# Patient Record
Sex: Female | Born: 2000 | Race: White | Hispanic: No | Marital: Single | Smoking: Never smoker
Health system: Southern US, Community
[De-identification: ages and names within clinical notes are randomized; demographics above are authoritative.]

## PROBLEM LIST (undated history)

## (undated) DIAGNOSIS — D759 Disease of blood and blood-forming organs, unspecified: Secondary | ICD-10-CM

## (undated) DIAGNOSIS — N920 Excessive and frequent menstruation with regular cycle: Secondary | ICD-10-CM

## (undated) HISTORY — DX: Disease of blood and blood-forming organs, unspecified: D75.9

## (undated) HISTORY — DX: Excessive and frequent menstruation with regular cycle: N92.0

---
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
## 2019-03-05 ENCOUNTER — Other Ambulatory Visit: Payer: Self-pay | Admitting: *Deleted

## 2019-03-05 DIAGNOSIS — Z3041 Encounter for surveillance of contraceptive pills: Secondary | ICD-10-CM

## 2019-03-05 MED ORDER — LEVONORGESTREL-ETHINYL ESTRAD 0.1-20 MG-MCG PO TABS: 1.0000 | ORAL_TABLET | Freq: Every day | ORAL | 6 refills | Status: DC

## 2019-03-27 ENCOUNTER — Other Ambulatory Visit: Payer: Self-pay

## 2019-03-27 ENCOUNTER — Ambulatory Visit (INDEPENDENT_AMBULATORY_CARE_PROVIDER_SITE_OTHER): Payer: BC Managed Care – PPO

## 2019-03-27 DIAGNOSIS — N760 Acute vaginitis: Secondary | ICD-10-CM | POA: Diagnosis not present

## 2019-03-27 DIAGNOSIS — N898 Other specified noninflammatory disorders of vagina: Secondary | ICD-10-CM

## 2019-03-27 DIAGNOSIS — B9689 Other specified bacterial agents as the cause of diseases classified elsewhere: Secondary | ICD-10-CM

## 2019-03-27 NOTE — Progress Notes (Signed)
SUBJECTIVE:  20 y.o. female complains of vaginal discharge for a couple of days.Denies abnormal vaginal bleeding or significant pelvic pain or fever. No UTI symptoms. Denies history of known exposure to STD.  No LMP recorded.  OBJECTIVE:  She appears well, afebrile. Urine dipstick:: not done    ASSESSMENT:  Vaginal Discharge:small amount  Vaginal Odor: not at this time   PLAN:   BVAG, CVAG probe sent to lab. Patient opt out GC/CH. Treatment: To be determined once lab results are received ROV prn if symptoms persist or worsen.

## 2019-03-28 LAB — CERVICOVAGINAL ANCILLARY ONLY
Bacterial Vaginitis (gardnerella): POSITIVE — AB
Candida Glabrata: NEGATIVE
Candida Vaginitis: NEGATIVE
Comment: NEGATIVE
Comment: NEGATIVE
Comment: NEGATIVE

## 2019-03-29 ENCOUNTER — Other Ambulatory Visit: Payer: Self-pay | Admitting: Family Medicine

## 2019-03-29 MED ORDER — METRONIDAZOLE 500 MG PO TABS: 500.0000 mg | ORAL_TABLET | Freq: Two times a day (BID) | ORAL | 0 refills | Status: DC

## 2019-03-29 NOTE — Progress Notes (Signed)
Lab results as BV--rx sent in.

## 2019-10-07 ENCOUNTER — Encounter: Payer: Self-pay | Admitting: Radiology

## 2019-10-16 ENCOUNTER — Other Ambulatory Visit: Payer: Self-pay

## 2019-10-16 ENCOUNTER — Other Ambulatory Visit: Payer: BC Managed Care – PPO

## 2019-10-16 DIAGNOSIS — Z20822 Contact with and (suspected) exposure to covid-19: Secondary | ICD-10-CM

## 2019-10-18 LAB — NOVEL CORONAVIRUS, NAA: SARS-CoV-2, NAA: NOT DETECTED

## 2019-10-18 LAB — SPECIMEN STATUS REPORT

## 2019-10-18 LAB — SARS-COV-2, NAA 2 DAY TAT

## 2022-08-14 ENCOUNTER — Emergency Department (HOSPITAL_COMMUNITY): Payer: Managed Care, Other (non HMO)

## 2022-08-14 ENCOUNTER — Encounter (HOSPITAL_COMMUNITY): Payer: Self-pay

## 2022-08-14 ENCOUNTER — Other Ambulatory Visit: Payer: Self-pay

## 2022-08-14 ENCOUNTER — Emergency Department (HOSPITAL_COMMUNITY)
Admission: EM | Admit: 2022-08-14 | Discharge: 2022-08-15 | Disposition: A | Discharge status: Home / Self Care | Payer: Managed Care, Other (non HMO) | Attending: Emergency Medicine | Admitting: Emergency Medicine

## 2022-08-14 DIAGNOSIS — R102 Pelvic and perineal pain: Secondary | ICD-10-CM | POA: Diagnosis not present

## 2022-08-14 DIAGNOSIS — N939 Abnormal uterine and vaginal bleeding, unspecified: Secondary | ICD-10-CM | POA: Insufficient documentation

## 2022-08-14 LAB — URINALYSIS, ROUTINE W REFLEX MICROSCOPIC
Bacteria, UA: NONE SEEN
Bilirubin Urine: NEGATIVE
Glucose, UA: NEGATIVE mg/dL
Hgb urine dipstick: NEGATIVE
Ketones, ur: NEGATIVE mg/dL
Leukocytes,Ua: NEGATIVE
Nitrite: NEGATIVE
Protein, ur: NEGATIVE mg/dL
Specific Gravity, Urine: 1.001 — ABNORMAL LOW (ref 1.005–1.030)
pH: 6 (ref 5.0–8.0)

## 2022-08-14 LAB — COMPREHENSIVE METABOLIC PANEL
ALT: 14 U/L (ref 0–44)
AST: 17 U/L (ref 15–41)
Albumin: 4.2 g/dL (ref 3.5–5.0)
Alkaline Phosphatase: 103 U/L (ref 38–126)
Anion gap: 10 (ref 5–15)
BUN: 8 mg/dL (ref 6–20)
CO2: 25 mmol/L (ref 22–32)
Calcium: 9.2 mg/dL (ref 8.9–10.3)
Chloride: 102 mmol/L (ref 98–111)
Creatinine, Ser: 0.74 mg/dL (ref 0.44–1.00)
GFR, Estimated: >60 mL/min (ref >60)
Glucose, Bld: 89 mg/dL (ref 70–99)
Potassium: 3.7 mmol/L (ref 3.5–5.1)
Sodium: 137 mmol/L (ref 135–145)
Total Bilirubin: 0.5 mg/dL (ref 0.3–1.2)
Total Protein: 7.3 g/dL (ref 6.5–8.1)

## 2022-08-14 LAB — CBC
HCT: 40.8 % (ref 36.0–46.0)
Hemoglobin: 13.8 g/dL (ref 12.0–15.0)
MCH: 31.9 pg (ref 26.0–34.0)
MCHC: 33.8 g/dL (ref 30.0–36.0)
MCV: 94.4 fL (ref 80.0–100.0)
Platelets: 383 10*3/uL (ref 150–400)
RBC: 4.32 MIL/uL (ref 3.87–5.11)
RDW: 12.2 % (ref 11.5–15.5)
WBC: 9.3 10*3/uL (ref 4.0–10.5)
nRBC: 0 % (ref 0.0–0.2)

## 2022-08-14 LAB — HCG, SERUM, QUALITATIVE: Preg, Serum: NEGATIVE

## 2022-08-14 NOTE — ED Notes (Signed)
Pt gone to ultrasound

## 2022-08-14 NOTE — ED Triage Notes (Signed)
Pt arrived from home via POV. Pt states that she had her period July 1st - 4th. Then last night when the pt wiped after using the restroom had some dark brown blood followed by a clump of what looks like tissue in the picture. Pt is lightly bleeding at present. Pt denies being pregnant but also denies any contraceptive use. Pt states that she is cramping, but it is a different cramp from the normal menstrual cramps currently 5/10 on pain scale.

## 2022-08-14 NOTE — ED Provider Notes (Incomplete)
[place] EMERGENCY DEPARTMENT AT [facility] Provider Note   CSN: [id number] Arrival date & time: 08/14/22  1903     History {Add pertinent medical, surgical, social history, OB history to HPI:1} Chief Complaint  Patient presents with   Vaginal Bleeding    Andrea Todd is a 22 y.o. female.  Patient presents to the emergency department complaining of vaginal bleeding.  Patient states that she had her normal period from July 1 through 4.  She states that last night she noticed dark brown blood followed by a clump of tissue after wiping.  She has been bleeding lightly today.  She states she has gone through 4 tampons throughout the day.  She denies contraceptive use.  Patient also endorses crampy abdominal pain, worse in the left lower pelvic region. No reported shortness of breath, dysuria, vaginal discharge, chest pain, headache, weakness.  Past medical history significant for blood disorder, reportedly similar to von Willebrand's but not von Willebrand's.  Patient unsure of actual disorder.  HPI     Home Medications Prior to Admission medications   Medication Sig Start Date End Date Taking? Authorizing Provider  ferrous fumarate (HEMOCYTE - 106 MG FE) 325 (106 Fe) MG TABS tablet Take by mouth.    [provider]  levonorgestrel-ethinyl estradiol (AVIANE) 0.1-20 MG-MCG tablet Take 1 tablet by mouth daily. 03/05/19   Anyanwu, Jethro Bastos, MD  metroNIDAZOLE (FLAGYL) 500 MG tablet Take 1 tablet (500 mg total) by mouth 2 (two) times daily. 03/29/19   Reva Bores, MD      Allergies    Patient has no known allergies.    Review of Systems   Review of Systems  Physical Exam Updated Vital Signs BP 128/83 (BP Location: Right Arm)   Pulse 66   Temp 98.4 F (36.9 C) (Oral)   Resp 18   LMP 07/25/2022 (Exact Date)   SpO2 100%  Physical Exam Vitals and nursing note reviewed.  Constitutional:      General: She is not in acute distress.    Appearance: She is  well-developed.  HENT:     Head: Normocephalic and atraumatic.  Eyes:     Conjunctiva/sclera: Conjunctivae normal.  Cardiovascular:     Rate and Rhythm: Normal rate and regular rhythm.     Heart sounds: No murmur heard. Pulmonary:     Effort: Pulmonary effort is normal. No respiratory distress.     Breath sounds: Normal breath sounds.  Abdominal:     Palpations: Abdomen is soft.     Tenderness: There is abdominal tenderness (Mild suprapubic tenderness).  Musculoskeletal:        General: No swelling.     Cervical back: Neck supple.  Skin:    General: Skin is warm and dry.     Capillary Refill: Capillary refill takes less than 2 seconds.  Neurological:     Mental Status: She is alert.  Psychiatric:        Mood and Affect: Mood normal.     ED Results / Procedures / Treatments   Labs (all labs ordered are listed, but only abnormal results are displayed) Labs Reviewed  URINALYSIS, ROUTINE W REFLEX MICROSCOPIC - Abnormal; Notable for the following components:      Result Value   Color, Urine COLORLESS (*)    Specific Gravity, Urine 1.001 (*)    All other components within normal limits  HCG, SERUM, QUALITATIVE  CBC  COMPREHENSIVE METABOLIC PANEL    EKG None  Radiology No  results found.  Procedures .1-3 Lead EKG Interpretation  Performed by: Darrick Grinder, PA-C Authorized by: Darrick Grinder, PA-C     Interpretation: normal     ECG rate:  66   ECG rate assessment: normal     Rhythm: sinus rhythm     Ectopy: none     Conduction: normal     {Document cardiac monitor, telemetry assessment procedure when appropriate:1}  Medications Ordered in ED Medications - No data to display  ED Course/ Medical Decision Making/ A&P   {   Click here for ABCD2, HEART and other calculatorsREFRESH Note before signing :1}                          Medical Decision Making Amount and/or Complexity of Data Reviewed Labs: ordered. Radiology: ordered.   This patient  presents to the ED for concern of vaginal bleeding, this involves an extensive number of treatment options, and is a complaint that carries with it a high risk of complications and morbidity.  The differential diagnosis includes abnormal uterine bleeding, ectopic pregnancy, failed pregnancy, others   Co morbidities that complicate the patient evaluation  Von Willebrand's-like disease   Additional history obtained:  Additional history obtained from visitor at bedside External records from outside source obtained and reviewed including primary care note showing history of heavy menstrual bleeding, previously evaluated by hematology with labs showing a low von Willebrand's factor but not diagnostic.  Patient was taking oral contraceptives   Lab Tests:  I Ordered, and personally interpreted labs.  The pertinent results include: Negative pregnancy test, unremarkable CBC, CMP, UA   Imaging Studies ordered:  I ordered imaging studies including pelvic ultrasound I independently visualized and interpreted imaging which showed *** I agree with the radiologist interpretation   Cardiac Monitoring: / EKG:  The patient was maintained on a cardiac monitor.  I personally viewed and interpreted the cardiac monitored which showed an underlying rhythm of: Sinus rhythm   Consultations Obtained:  I requested consultation with the ***,  and discussed lab and imaging findings as well as pertinent plan - they recommend: ***   Problem List / ED Course / Critical interventions / Medication management  *** I ordered medication including ***  for ***  Reevaluation of the patient after these medicines showed that the patient {resolved/improved/worsened:23923::"improved"} I have reviewed the patients home medicines and have made adjustments as needed   Social Determinants of Health:  ***   Test / Admission - Considered:  ***   {Document critical care time when appropriate:1} {Document review  of labs and clinical decision tools ie heart score, Chads2Vasc2 etc:1}  {Document your independent review of radiology images, and any outside records:1} {Document your discussion with family members, caretakers, and with consultants:1} {Document social determinants of health affecting pt's care:1} {Document your decision making why or why not admission, treatments were needed:1} Final Clinical Impression(s) / ED Diagnoses Final diagnoses:  Pelvic pain    Rx / DC Orders ED Discharge Orders     None

## 2022-08-15 NOTE — Discharge Instructions (Signed)
You were evaluated tonight for vaginal bleeding.  Your ultrasound was reassuring with no acute findings.  Your lab work shows no signs of anemia at this time.  If you develop shortness of breath, dizziness, or other life-threatening symptoms please return to the emergency department.  Otherwise please schedule a follow-up appointment with OB/GYN for further evaluation of your abnormal uterine bleeding.

## 2022-11-06 NOTE — Progress Notes (Unsigned)
   New Patient Office Visit   Subjective   Patient ID: Andrea Todd, female    DOB: [birth year]  Age: 22 y.o. MRN: [id number]  CC: No chief complaint on file.   HPI Andrea Todd 22 year old female, presents to establish care. She  has a past medical history of Blood disorder.  HPI    Outpatient Encounter Medications as of 11/07/2022  Medication Sig   ferrous fumarate (HEMOCYTE - 106 MG FE) 325 (106 Fe) MG TABS tablet Take by mouth.   levonorgestrel-ethinyl estradiol (AVIANE) 0.1-20 MG-MCG tablet Take 1 tablet by mouth daily.   metroNIDAZOLE (FLAGYL) 500 MG tablet Take 1 tablet (500 mg total) by mouth 2 (two) times daily.   No facility-administered encounter medications on file as of 11/07/2022.    No past surgical history on file.  ROS    Objective    There were no vitals taken for this visit.  Physical Exam    Assessment & Plan:  There are no diagnoses linked to this encounter.  No follow-ups on file.   Cruzita Lederer Newman Nip, FNP

## 2022-11-06 NOTE — Patient Instructions (Signed)

## 2022-11-07 ENCOUNTER — Ambulatory Visit (INDEPENDENT_AMBULATORY_CARE_PROVIDER_SITE_OTHER): Payer: Managed Care, Other (non HMO) | Admitting: Family Medicine

## 2022-11-07 ENCOUNTER — Encounter: Payer: Self-pay | Admitting: Family Medicine

## 2022-11-07 VITALS — BP 119/80 | HR 98 | Ht 65.0 in | Wt 182.0 lb

## 2022-11-07 DIAGNOSIS — D509 Iron deficiency anemia, unspecified: Secondary | ICD-10-CM | POA: Diagnosis not present

## 2022-11-07 DIAGNOSIS — E559 Vitamin D deficiency, unspecified: Secondary | ICD-10-CM | POA: Diagnosis not present

## 2022-11-07 DIAGNOSIS — R7301 Impaired fasting glucose: Secondary | ICD-10-CM

## 2022-11-07 DIAGNOSIS — Z114 Encounter for screening for human immunodeficiency virus [HIV]: Secondary | ICD-10-CM

## 2022-11-07 DIAGNOSIS — F320 Major depressive disorder, single episode, mild: Secondary | ICD-10-CM

## 2022-11-07 DIAGNOSIS — N946 Dysmenorrhea, unspecified: Secondary | ICD-10-CM | POA: Insufficient documentation

## 2022-11-07 DIAGNOSIS — E038 Other specified hypothyroidism: Secondary | ICD-10-CM

## 2022-11-07 DIAGNOSIS — Z1159 Encounter for screening for other viral diseases: Secondary | ICD-10-CM

## 2022-11-07 DIAGNOSIS — M5441 Lumbago with sciatica, right side: Secondary | ICD-10-CM

## 2022-11-07 DIAGNOSIS — Z136 Encounter for screening for cardiovascular disorders: Secondary | ICD-10-CM

## 2022-11-07 DIAGNOSIS — G8929 Other chronic pain: Secondary | ICD-10-CM

## 2022-11-07 DIAGNOSIS — M545 Low back pain, unspecified: Secondary | ICD-10-CM | POA: Insufficient documentation

## 2022-11-07 MED ORDER — MELOXICAM 7.5 MG PO TABS: 7.5000 mg | ORAL_TABLET | Freq: Every day | ORAL | 2 refills | Status: DC

## 2022-11-07 NOTE — Assessment & Plan Note (Signed)
Trial on Mobic 7.5 mg PRN We discussed the desired effects and potential side effects of the prescribed medication for back pain. Additionally, we reviewed non-pharmacological interventions, including the importance of rest, avoiding twisting, improper bending, and straining the lower back. I demonstrated proper body mechanics to prevent further injury and advised alternating between ice and heat therapy for relief. Stretching exercises for both the back and legs were recommended to improve flexibility and support recovery. The patient was advised to follow up if symptoms worsen or persist. The patient expressed understanding of the treatment plan, and all questions were thoroughly addressed.

## 2022-11-07 NOTE — Assessment & Plan Note (Signed)
Flowsheet Row Office Visit from 11/07/2022 in [facility]  PHQ-9 Total Score 11      Patient interested in starting therapy no medication intervention.  We discussed several non-pharmacological approaches to managing depression, including:  Establishing a consistent daily routine: This helps create structure and stability. Practicing mindfulness and relaxation techniques: Incorporating meditation, deep breathing exercises, or yoga to manage stress and improve emotional well-being. Engaging in regular physical activity: Aim for at least 30 minutes of exercise most days to boost mood and energy levels. Spending time outdoors: Exposure to natural light and fresh air can improve mental health. Building a support network: Encouraging social connections with friends, family, or support groups to reduce feelings of isolation. Prioritizing a balanced diet: Eating nutrient-rich foods while avoiding excessive amounts of processed foods, sugar, and unhealthy fats.   Patient verbally consented to [facility] services about presenting concerns and psychiatric consultation as appropriate. The services will be billed as appropriate for the patient.

## 2022-11-14 ENCOUNTER — Other Ambulatory Visit: Payer: Self-pay

## 2022-11-14 ENCOUNTER — Telehealth: Payer: Self-pay | Admitting: Family Medicine

## 2022-11-14 ENCOUNTER — Encounter (HOSPITAL_COMMUNITY): Payer: Self-pay | Admitting: *Deleted

## 2022-11-14 ENCOUNTER — Emergency Department (HOSPITAL_COMMUNITY)
Admission: EM | Admit: 2022-11-14 | Discharge: 2022-11-14 | Discharge status: Against Medical Advice | Payer: Managed Care, Other (non HMO) | Attending: Emergency Medicine | Admitting: Emergency Medicine

## 2022-11-14 DIAGNOSIS — Z5329 Procedure and treatment not carried out because of patient's decision for other reasons: Secondary | ICD-10-CM | POA: Diagnosis not present

## 2022-11-14 DIAGNOSIS — Z5321 Procedure and treatment not carried out due to patient leaving prior to being seen by health care provider: Secondary | ICD-10-CM

## 2022-11-14 DIAGNOSIS — M549 Dorsalgia, unspecified: Secondary | ICD-10-CM | POA: Insufficient documentation

## 2022-11-14 LAB — POC URINE PREG, ED: Preg Test, Ur: NEGATIVE

## 2022-11-14 NOTE — Telephone Encounter (Signed)
Patient calling says her back is still in extreme pain- went to ED this morning and said they tried to do a urine sample but took way too long and her urine got cold. Patient is wanting to speak with someone regarding recommendations. Please advise Thank you

## 2022-11-14 NOTE — ED Triage Notes (Signed)
Pt states she woke up this am with pain to bilateral ribs and lower back; pt states it hurts worse with deep breathing

## 2022-11-14 NOTE — Telephone Encounter (Signed)
Pt called back LVM in regard to previous tele message. Wants a call back

## 2022-11-14 NOTE — ED Provider Notes (Signed)
[place] EMERGENCY DEPARTMENT AT [facility] Provider Note   CSN: [id number] Arrival date & time: 11/14/22  4401     History Chief Complaint  Patient presents with   Back Pain    Andrea Todd is a 22 y.o. female with h/o chronic back pain presents the emerged from today for evaluation of back pain.  Patient reports that she woke up in pain this morning.  Pain is worse with movement.  She reports that whenever she takes a deep breath she feels that the muscles catch in her back.  She denies any shortness of breath or any chest pain.  Denies any recent cough or cold symptoms or any fevers.  Denies any falls or any traumas.  Unsure about the nursing note says abdominal pain is patient is not complaining about any abdominal pain or any nausea or vomiting.  She reports that she recently celebrated her birthday and was partying a lot this weekend and was dancing was more active that she thinks may be causing her pain.  She denies any recent surgeries any long car rides.  She is currently not taking any OCPs.  She denies any chest pain, vomiting, Donnell pain, diarrhea, constipation, hematochezia, melena, dysuria, hematuria, vaginal bleeding or vaginal discharge, or any shortness of breath.  Her primary care provider prescribed her meloxicam however she has not taken it other than today.   Back Pain Associated symptoms: no chest pain, no dysuria, no fever, no numbness and no weakness        Home Medications Prior to Admission medications   Medication Sig Start Date End Date Taking? Authorizing Provider  ferrous fumarate (HEMOCYTE - 106 MG FE) 325 (106 Fe) MG TABS tablet Take by mouth.    [provider]  fluticasone (FLONASE) 50 MCG/ACT nasal spray Place into the nose. 02/02/22 02/02/23  [provider]  levonorgestrel-ethinyl estradiol (AVIANE) 0.1-20 MG-MCG tablet Take 1 tablet by mouth daily. Patient not taking: Reported on 11/07/2022 03/05/19   Tereso Newcomer, MD  meloxicam (MOBIC) 7.5 MG tablet Take 1 tablet (7.5 mg total) by mouth daily. 11/07/22   Del Nigel Berthold, FNP  metroNIDAZOLE (FLAGYL) 500 MG tablet Take 1 tablet (500 mg total) by mouth 2 (two) times daily. Patient not taking: Reported on 11/07/2022 03/29/19   Reva Bores, MD      Allergies    Patient has no known allergies.    Review of Systems   Review of Systems  Constitutional:  Negative for chills and fever.  Respiratory:  Negative for shortness of breath.   Cardiovascular:  Negative for chest pain.  Gastrointestinal:  Negative for abdominal distention, constipation, diarrhea, nausea and vomiting.       Denies any fecal incontinence  Genitourinary:  Negative for dysuria and hematuria.       Denies any urinary incontinence or urinary retention.  Musculoskeletal:  Positive for back pain.       Denies any saddle anesthesia  Neurological:  Negative for weakness and numbness.    Physical Exam Updated Vital Signs BP (!) 134/99 (BP Location: Right Arm)   Pulse 71   Temp (!) 97.5 F (36.4 C) (Oral)   Ht 5\' 5"  (1.651 m)   Wt 82.6 kg   LMP 11/06/2022   SpO2 97%   BMI 30.28 kg/m  Physical Exam Vitals and nursing note reviewed.  Constitutional:      General: She is not in acute distress.    Appearance: She  is not ill-appearing or toxic-appearing.  Eyes:     General: No scleral icterus. Cardiovascular:     Rate and Rhythm: Normal rate.  Pulmonary:     Effort: Pulmonary effort is normal. No respiratory distress.     Breath sounds: Normal breath sounds.  Abdominal:     Palpations: Abdomen is soft.     Tenderness: There is no abdominal tenderness. There is no guarding or rebound.  Musculoskeletal:     Cervical back: Normal range of motion. No rigidity.       Back:     Comments: Tenderness palpation upon the marked areas.  No midline tenderness palpation.  No overlying skin changes noted.  No overlying erythema, warmth, induration, fluctuance, or rash noted.   Strength is equal and symmetric in patient's bilateral upper and lower extremities.  Sensation reportedly intact.  She is amatory with steady gait.  Skin:    General: Skin is warm and dry.  Neurological:     General: No focal deficit present.     Mental Status: She is alert. Mental status is at baseline.     Gait: Gait normal.     ED Results / Procedures / Treatments   Labs (all labs ordered are listed, but only abnormal results are displayed) Labs Reviewed  POC URINE PREG, ED    EKG None  Radiology No results found.  Procedures Procedures   Medications Ordered in ED Medications - No data to display  ED Course/ Medical Decision Making/ A&P   Medical Decision Making  22 y.o. female presents to the ER for evaluation of back pain. Differential diagnosis includes but is not limited to Fracture (acute/chronic), muscle strain, cauda equina, spinal stenosis, DDD, ligamentous injury, disk herniation, metastatic cancer, vertebral osteomyelitis, kidney stone, pyelonephritis, AAA, pancreatitis, bowel obstruction, meningitis. Vital signs Mildly elevated BP at 134/99, temp 97.11F. RR was not documented, however was around 16. She was not tachypenic and was speaking in full sentences. Physical exam as noted above.   The pain is reproducible upon palpation and upon movement.  I do not see any overlying skin changes.  I think this is likely musculoskeletal.  She denies any trauma to the area.  She declines x-rays.  I discussed with her that I will need a urine sample before giving medications  I independently reviewed and interpreted the patient's labs. Urinalysis still needs to be collected.  Overall, I do not think this is any meningitis.  She does not have any recent cough or cold symptoms.  She has no nuchal rigidity.  She is ambulatory with steady gait and is moving all extremities and equal strength.  She denies any IV drug use ever, I have lower excision for any vertebral osteomyelitis  or any epidural abscess.  This is likely musculoskeletal pain as the pain is mainly paraspinal in comparison to midline.  Unfortunately, was alerted that patient eloped from the ER.   Portions of this report may have been transcribed using voice recognition software. Every effort was made to ensure accuracy; however, inadvertent computerized transcription errors may be present.   Final Clinical Impression(s) / ED Diagnoses Final diagnoses:  Eloped from emergency department    Rx / DC Orders ED Discharge Orders     None         Achille Rich, PA-C 11/14/22 1019    Pricilla Loveless, MD 11/17/22 0710

## 2022-11-20 NOTE — Patient Instructions (Signed)

## 2022-11-20 NOTE — Progress Notes (Unsigned)
   Established Patient Office Visit   Subjective  Patient ID: Andrea Todd, female    DOB: 09-26-2000  Age: 22 y.o. MRN: [id number]  No chief complaint on file.   She  has a past medical history of Blood disorder and Heavy menstrual bleeding.  HPI  Patient presents to the clinic for    ROS    Objective:     LMP 11/06/2022  {Vitals History (Optional):23777}  Physical Exam   No results found for any visits on 11/21/22.  The ASCVD Risk score (Arnett DK, et al., 2019) failed to calculate for the following reasons:   The 2019 ASCVD risk score is only valid for ages 64 to 87    Assessment & Plan:  There are no diagnoses linked to this encounter.  No follow-ups on file.   Cruzita Lederer Newman Nip, FNP

## 2022-11-21 ENCOUNTER — Encounter: Payer: Self-pay | Admitting: Family Medicine

## 2022-11-21 ENCOUNTER — Ambulatory Visit (INDEPENDENT_AMBULATORY_CARE_PROVIDER_SITE_OTHER): Payer: Managed Care, Other (non HMO) | Admitting: Family Medicine

## 2022-11-21 VITALS — BP 116/76 | HR 111 | Ht 65.0 in | Wt 186.1 lb

## 2022-11-21 DIAGNOSIS — M545 Low back pain, unspecified: Secondary | ICD-10-CM

## 2022-11-21 NOTE — Assessment & Plan Note (Signed)
Patient reports that her back pain has resolved and is well-controlled with Flexeril 10 mg, which she takes nightly. However, if she misses a dose, the back pain returns Referral placed for physical therapy  Referral placed to Orthopedics for further evaluation

## 2022-11-29 LAB — LIPID PANEL
Chol/HDL Ratio: 2.6 {ratio} (ref 0.0–4.4)
Cholesterol, Total: 147 mg/dL (ref 100–199)
HDL: 56 mg/dL (ref >39)
LDL Chol Calc (NIH): 75 mg/dL (ref 0–99)
Triglycerides: 87 mg/dL (ref 0–149)
VLDL Cholesterol Cal: 16 mg/dL (ref 5–40)

## 2022-11-29 LAB — CMP14+EGFR
ALT: 15 [IU]/L (ref 0–32)
AST: 17 [IU]/L (ref 0–40)
Albumin: 4.4 g/dL (ref 4.0–5.0)
Alkaline Phosphatase: 104 [IU]/L (ref 44–121)
BUN/Creatinine Ratio: 20 (ref 9–23)
BUN: 15 mg/dL (ref 6–20)
Bilirubin Total: 0.4 mg/dL (ref 0.0–1.2)
CO2: 19 mmol/L — ABNORMAL LOW (ref 20–29)
Calcium: 9.4 mg/dL (ref 8.7–10.2)
Chloride: 101 mmol/L (ref 96–106)
Creatinine, Ser: 0.76 mg/dL (ref 0.57–1.00)
Globulin, Total: 2.5 g/dL (ref 1.5–4.5)
Glucose: 108 mg/dL — ABNORMAL HIGH (ref 70–99)
Potassium: 4.4 mmol/L (ref 3.5–5.2)
Sodium: 137 mmol/L (ref 134–144)
Total Protein: 6.9 g/dL (ref 6.0–8.5)
eGFR: 113 mL/min/{1.73_m2} (ref >59)

## 2022-11-29 LAB — CBC WITH DIFFERENTIAL/PLATELET
Basophils Absolute: 0.1 10*3/uL (ref 0.0–0.2)
Basos: 1 %
EOS (ABSOLUTE): 0.3 10*3/uL (ref 0.0–0.4)
Eos: 3 %
Hematocrit: 44 % (ref 34.0–46.6)
Hemoglobin: 14.3 g/dL (ref 11.1–15.9)
Immature Grans (Abs): 0 10*3/uL (ref 0.0–0.1)
Immature Granulocytes: 0 %
Lymphocytes Absolute: 3.4 10*3/uL — ABNORMAL HIGH (ref 0.7–3.1)
Lymphs: 31 %
MCH: 31.2 pg (ref 26.6–33.0)
MCHC: 32.5 g/dL (ref 31.5–35.7)
MCV: 96 fL (ref 79–97)
Monocytes Absolute: 0.9 10*3/uL (ref 0.1–0.9)
Monocytes: 9 %
Neutrophils Absolute: 6.1 10*3/uL (ref 1.4–7.0)
Neutrophils: 56 %
Platelets: 488 10*3/uL — ABNORMAL HIGH (ref 150–450)
RBC: 4.59 x10E6/uL (ref 3.77–5.28)
RDW: 12 % (ref 11.7–15.4)
WBC: 10.8 10*3/uL (ref 3.4–10.8)

## 2022-11-29 LAB — IRON,TIBC AND FERRITIN PANEL
Ferritin: 74 ng/mL (ref 15–150)
Iron Saturation: 31 % (ref 15–55)
Iron: 87 ug/dL (ref 27–159)
Total Iron Binding Capacity: 283 ug/dL (ref 250–450)
UIBC: 196 ug/dL (ref 131–425)

## 2022-11-29 LAB — HIV ANTIBODY (ROUTINE TESTING W REFLEX): HIV Screen 4th Generation wRfx: NONREACTIVE

## 2022-11-29 LAB — MICROALBUMIN / CREATININE URINE RATIO
Creatinine, Urine: 196.7 mg/dL
Microalb/Creat Ratio: 6 mg/g{creat} (ref 0–29)
Microalbumin, Urine: 11 ug/mL

## 2022-11-29 LAB — ANTINUCLEAR ANTIBODIES, IFA: ANA Titer 1: NEGATIVE

## 2022-11-29 LAB — HEMOGLOBIN A1C
Est. average glucose Bld gHb Est-mCnc: 103 mg/dL
Hgb A1c MFr Bld: 5.2 % (ref 4.8–5.6)

## 2022-11-29 LAB — TSH+FREE T4
Free T4: 1.5 ng/dL (ref 0.82–1.77)
TSH: 1.83 u[IU]/mL (ref 0.450–4.500)

## 2022-11-29 LAB — HEPATITIS C ANTIBODY: Hep C Virus Ab: NONREACTIVE

## 2022-11-29 LAB — VITAMIN B12: Vitamin B-12: 186 pg/mL — ABNORMAL LOW (ref 232–1245)

## 2022-11-29 LAB — VITAMIN D 25 HYDROXY (VIT D DEFICIENCY, FRACTURES): Vit D, 25-Hydroxy: 22.8 ng/mL — ABNORMAL LOW (ref 30.0–100.0)

## 2022-12-08 ENCOUNTER — Telehealth: Payer: Self-pay | Admitting: Orthopedic Surgery

## 2022-12-08 NOTE — Telephone Encounter (Signed)
Patient called back to return Hopes call and she states she will call back and R/S after she has her PT completed

## 2022-12-09 ENCOUNTER — Ambulatory Visit: Payer: Managed Care, Other (non HMO) | Admitting: Orthopedic Surgery

## 2023-01-01 NOTE — Patient Instructions (Signed)

## 2023-01-01 NOTE — Progress Notes (Unsigned)
   Established Patient Office Visit   Subjective  Patient ID: Andrea Todd, female    DOB: 11-28-2000  Age: 22 y.o. MRN: [id number]  No chief complaint on file.   She  has a past medical history of Blood disorder and Heavy menstrual bleeding.  HPI  ROS    Objective:     There were no vitals taken for this visit. {Vitals History (Optional):23777}  Physical Exam   No results found for any visits on 01/02/23.  The ASCVD Risk score (Arnett DK, et al., 2019) failed to calculate for the following reasons:   The 2019 ASCVD risk score is only valid for ages 86 to 47    Assessment & Plan:  There are no diagnoses linked to this encounter.  No follow-ups on file.   Cruzita Lederer Newman Nip, FNP

## 2023-01-02 ENCOUNTER — Encounter: Payer: Self-pay | Admitting: Family Medicine

## 2023-01-02 ENCOUNTER — Ambulatory Visit (INDEPENDENT_AMBULATORY_CARE_PROVIDER_SITE_OTHER): Payer: Managed Care, Other (non HMO) | Admitting: Family Medicine

## 2023-01-02 VITALS — BP 121/81 | HR 86 | Ht 65.0 in | Wt 190.0 lb

## 2023-01-02 DIAGNOSIS — M5441 Lumbago with sciatica, right side: Secondary | ICD-10-CM | POA: Diagnosis not present

## 2023-01-02 DIAGNOSIS — G8929 Other chronic pain: Secondary | ICD-10-CM | POA: Diagnosis not present

## 2023-01-02 DIAGNOSIS — M545 Low back pain, unspecified: Secondary | ICD-10-CM

## 2023-01-02 MED ORDER — CYCLOBENZAPRINE HCL 10 MG PO TABS
10.0000 mg | ORAL_TABLET | Freq: Every evening | ORAL | 2 refills | Status: DC | PRN
Start: 2023-01-02 — End: 2023-02-27

## 2023-01-02 MED ORDER — MELOXICAM 7.5 MG PO TABS
7.5000 mg | ORAL_TABLET | Freq: Every day | ORAL | 1 refills | Status: DC | PRN
Start: 2023-01-02 — End: 2023-02-27

## 2023-01-02 NOTE — Assessment & Plan Note (Addendum)
Controlled on Flexeril PRN and Mobic PRN Patient would like referral to physical therapy placed again We discussed the desired effects and potential side effects of the prescribed medication for back pain. Additionally, we reviewed non-pharmacological interventions, including the importance of rest, avoiding twisting, improper bending, and straining the lower back. I demonstrated proper body mechanics to prevent further injury and advised alternating between ice and heat therapy for relief. Stretching exercises for both the back and legs were recommended to improve flexibility and support recovery. The patient was advised to follow up if symptoms worsen or persist. The patient expressed understanding of the treatment plan, and all questions were thoroughly addressed.

## 2023-01-09 ENCOUNTER — Ambulatory Visit (HOSPITAL_COMMUNITY): Payer: Managed Care, Other (non HMO)

## 2023-01-17 ENCOUNTER — Ambulatory Visit: Payer: Self-pay | Admitting: Family Medicine

## 2023-01-17 ENCOUNTER — Telehealth: Payer: Self-pay | Admitting: Family Medicine

## 2023-01-17 ENCOUNTER — Telehealth: Payer: Managed Care, Other (non HMO) | Admitting: Family Medicine

## 2023-01-17 VITALS — Temp 98.5°F | Ht 65.0 in | Wt 190.0 lb

## 2023-01-17 DIAGNOSIS — J069 Acute upper respiratory infection, unspecified: Secondary | ICD-10-CM | POA: Diagnosis not present

## 2023-01-17 NOTE — Progress Notes (Signed)
[facility]ARE [facility] [address] [place] [address] Dept: [phone] Dept Fax: [phone]  Virtual Video Visit  I connected with Andrea Todd on 01/17/23 at 12:20 PM EST by a video enabled telemedicine application and verified that I am speaking with the correct person using two identifiers.  Location patient: Home Location provider: Clinic Persons participating in the virtual visit: Patient, Provider  I discussed the limitations of evaluation and management by telemedicine and the availability of in person appointments. The patient expressed understanding and agreed to proceed.  Chief Complaint  Patient presents with   Cough    C/o having a cough, head/chest congestion, ears stopped up, and HA x 1 week.  Has taken Ibuprofen and drinking honey.    SUBJECTIVE:  HPI: Andrea Todd is a 22 y.o. female who presents with a 1-week history of nasal congestion and cough. 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 She was sent home and recommended to see a doctor to evaluate if she could have bronchitis. She denies any fever, Her ears have been plugged up. She is having no N/V/D. She has been taking ibuprofen and using honey.  Patient Active Problem List   Diagnosis Date Noted   Dysmenorrhea 11/07/2022   Lumbar pain 11/07/2022   Depression, major, single episode, mild (HCC) 11/07/2022   Oral contraceptive pill surveillance 12/19/2018   Distal radius fracture 11/18/2010   History reviewed. No pertinent surgical history. Family History  Problem Relation Age of Onset   Hypertension Mother    Hypertension Maternal Grandmother    Diabetes Maternal Grandmother    Hypertension Maternal Grandfather    Hypertension Paternal Grandmother    Diabetes Paternal Grandmother    Hypertension Paternal Grandfather    Social History   Tobacco Use   Smoking status: Every Day    Current  packs/day: 0.50    Types: Cigarettes   Smokeless tobacco: Never  Vaping Use   Vaping status: Every Day   Substances: Flavoring  Substance Use Topics   Alcohol use: No    Comment: occasional   Drug use: Yes    Types: Marijuana    Current Outpatient Medications:    cholecalciferol (VITAMIN D3) 25 MCG (1000 UNIT) tablet, Take 1,000 Units by mouth daily., Disp: , Rfl:    cyclobenzaprine (FLEXERIL) 10 MG tablet, Take 1 tablet (10 mg total) by mouth at bedtime as needed., Disp: 30 tablet, Rfl: 2   ferrous sulfate 325 (65 FE) MG EC tablet, Take 325 mg by mouth daily with breakfast., Disp: , Rfl:    meloxicam (MOBIC) 7.5 MG tablet, Take 1 tablet (7.5 mg total) by mouth daily as needed for pain., Disp: 30 tablet, Rfl: 1   Multiple Vitamin (MULTIVITAMIN WITH MINERALS) TABS tablet, Take 1 tablet by mouth daily., Disp: , Rfl:    vitamin B-12 (CYANOCOBALAMIN) 100 MCG tablet, Take 100 mcg by mouth daily., Disp: , Rfl:  No Known Allergies ROS: See pertinent positives and negatives per HPI.  OBSERVATIONS/OBJECTIVE:  VITALS per patient if applicable: Today's Vitals   01/17/23 1205  Temp: 98.5 F (36.9 C)  TempSrc: Temporal  Weight: 190 lb (86.2 kg)  Height: 5\' 5"  (1.651 m)   Body mass index is 31.62 kg/m.   GENERAL: Alert and oriented. Appears well and in no acute distress. HEENT: Atraumatic. Eyes clear. No obvious abnormalities on inspection of external nose and ears. NECK: Normal movements of the  head and neck. LUNGS: On inspection, no signs of respiratory distress. Breathing rate appears normal. No obvious gross SOB, gasping or wheezing, and no conversational dyspnea. Mild cough present. CV: No obvious cyanosis. MS: Moves all visible extremities without noticeable abnormality. PSYCH/NEURO: Pleasant and cooperative. No obvious depression or anxiety. Speech and thought processing grossly intact.  ASSESSMENT AND PLAN:  Problem List Items Addressed This Visit       Respiratory    Viral URI with cough - Primary   Discussed home care for viral illness, including rest, pushing fluids, and OTC medications as needed for symptom relief. Recommend hot tea with honey for sore throat symptoms. Benadryl as needed for rhinorrhea. Follow-up if needed for worsening or persistent symptoms.         I discussed the assessment and treatment plan with the patient. The patient was provided an opportunity to ask questions and all were answered. The patient agreed with the plan and demonstrated an understanding of the instructions.   The patient was advised to call back or seek an in-person evaluation if the symptoms worsen or if the condition fails to improve as anticipated.  Return if symptoms worsen or fail to improve.   Loyola Mast, MD

## 2023-01-17 NOTE — Telephone Encounter (Signed)
Reason for Disposition . [1] Continuous (nonstop) coughing interferes with work or school AND [2] no improvement using cough treatment per Care Advice  Answer Assessment - Initial Assessment Questions 1. ONSET: "When did the cough begin?"      X 1 week 2. SEVERITY: "How bad is the cough today?"      Worse at night; pt states it is currently fine but it is intermittent. Lasts a few minutes and comes and goes.  3. SPUTUM: "Describe the color of your sputum" (none, dry cough; clear, white, yellow, green)     Denies sputum.  4. HEMOPTYSIS: "Are you coughing up any blood?" If so ask: "How much?" (flecks, streaks, tablespoons, etc.)     Denies blood.  5. DIFFICULTY BREATHING: "Are you having difficulty breathing?" If Yes, ask: "How bad is it?" (e.g., mild, moderate, severe)    - MILD: No SOB at rest, mild SOB with walking, speaks normally in sentences, can lie down, no retractions, pulse < 100.    - MODERATE: SOB at rest, SOB with minimal exertion and prefers to sit, cannot lie down flat, speaks in phrases, mild retractions, audible wheezing, pulse 100-120.    - SEVERE: Very SOB at rest, speaks in single words, struggling to breathe, sitting hunched forward, retractions, pulse > 120      Denies SOB.  6. FEVER: "Do you have a fever?" If Yes, ask: "What is your temperature, how was it measured, and when did it start?"     Denies fever.  7. CARDIAC HISTORY: "Do you have any history of heart disease?" (e.g., heart attack, congestive heart failure)      Denies cardiac history.  8. LUNG HISTORY: "Do you have any history of lung disease?"  (e.g., pulmonary embolus, asthma, emphysema)     Denies lung history.  9. PE RISK FACTORS: "Do you have a history of blood clots?" (or: recent major surgery, recent prolonged travel, bedridden)     Pt states she has a known blood disorder that causes thicker blood and clotting.  10. OTHER SYMPTOMS: "Do you have any other symptoms?" (e.g., runny nose, wheezing,  chest pain)       Pt reports 1st two days experienced body aches and chills which have resolved. Pt reports sore chest, dry and itchy throat, congestion, headache, both ears feel "stopped up.  11. PREGNANCY: "Is there any chance you are pregnant?" "When was your last menstrual period?"       LMP 2 weeks ago.  12. TRAVEL: "Have you traveled out of the country in the last month?" (e.g., travel history, exposures)       Denies travel out of the country; pt's states her little brother had pneumonia 1-2 weeks ago.  Protocols used: Cough - Acute Productive-A-AH

## 2023-01-17 NOTE — Telephone Encounter (Addendum)
  Chief Complaint: cough, congestion  Symptoms: dry cough, sore chest, dry and itchy throat, congestion, headache, ears feels "stopped up"  Frequency: x 1 week  Pertinent Negatives: Patient denies SOB, fevers, dizziness  Disposition: [] ED /[] Urgent Care (no appt availability in office) / [x] Appointment(In office/virtual)/ []  [facility]/ [] Home Care/ [] Refused Recommended Disposition /[] [facility]/ []  Follow-up with PCP Additional Notes: Patient states she has been sick x 1 week, she attempted to go to work today and was sent home due to cough and congestion. Pt states she has been treating at home with ibuprofen and honey. Pt c/o cough, dry itchy throat, sore chest from coughing, congested nos/ears and headaches. Pt states her little brother was diagnosed with pneumonia and she had been exposed to him. Pt agreeable to virtual visit today with [facility] PCP.    Copied from CRM [id number]. Topic: Clinical - Medical Advice >> Jan 17, 2023 11:22 AM Fuller Mandril wrote: Reason for CRM: Pt called in was sent home from work because she is sick. Wanted to see if an appt was available today, 1st available 12/26. Pt has symptoms of cough, congestions, head and ears stopped up, and headache. Would like to speak to someone if possible to see what she can do in the mean time. Thank you.

## 2023-01-17 NOTE — Assessment & Plan Note (Signed)
Discussed home care for viral illness, including rest, pushing fluids, and OTC medications as needed for symptom relief. Recommend hot tea with honey for sore throat symptoms. Benadryl as needed for rhinorrhea. Follow-up if needed for worsening or persistent symptoms.

## 2023-01-17 NOTE — Telephone Encounter (Unsigned)
Copied from CRM [id number]. Topic: Clinical - Medical Advice >> Jan 17, 2023 11:34 AM Ivette P wrote: Reason for CRM: Pt called in was sent home from work because she is sick. Wanted to see if an appt was available today, 1st available 12/26. Pt has symptoms of cough, congestions, head and ears stopped up, and headache. Would like to speak to someone if possible to see what she can do in the mean time. Thank you.

## 2023-01-23 ENCOUNTER — Ambulatory Visit (HOSPITAL_COMMUNITY): Payer: Managed Care, Other (non HMO) | Attending: Family Medicine

## 2023-01-23 ENCOUNTER — Encounter (HOSPITAL_COMMUNITY): Payer: Self-pay

## 2023-01-23 ENCOUNTER — Other Ambulatory Visit: Payer: Self-pay

## 2023-01-23 DIAGNOSIS — G8929 Other chronic pain: Secondary | ICD-10-CM | POA: Diagnosis present

## 2023-01-23 DIAGNOSIS — M6281 Muscle weakness (generalized): Secondary | ICD-10-CM | POA: Diagnosis present

## 2023-01-23 DIAGNOSIS — M5441 Lumbago with sciatica, right side: Secondary | ICD-10-CM | POA: Insufficient documentation
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
## 2023-02-08 ENCOUNTER — Emergency Department (HOSPITAL_COMMUNITY): Payer: Managed Care, Other (non HMO)

## 2023-02-08 ENCOUNTER — Inpatient Hospital Stay (HOSPITAL_COMMUNITY)
Admission: EM | Admit: 2023-02-08 | Discharge: 2023-02-11 | DRG: 419 | Disposition: A | Discharge status: Home / Self Care | Payer: Managed Care, Other (non HMO) | Attending: General Surgery | Admitting: General Surgery

## 2023-02-08 ENCOUNTER — Other Ambulatory Visit: Payer: Self-pay

## 2023-02-08 ENCOUNTER — Other Ambulatory Visit: Payer: Self-pay | Admitting: *Deleted

## 2023-02-08 DIAGNOSIS — K810 Acute cholecystitis: Secondary | ICD-10-CM | POA: Insufficient documentation

## 2023-02-08 DIAGNOSIS — Z862 Personal history of diseases of the blood and blood-forming organs and certain disorders involving the immune mechanism: Secondary | ICD-10-CM

## 2023-02-08 DIAGNOSIS — G8929 Other chronic pain: Secondary | ICD-10-CM | POA: Diagnosis present

## 2023-02-08 DIAGNOSIS — K8020 Calculus of gallbladder without cholecystitis without obstruction: Secondary | ICD-10-CM | POA: Diagnosis not present

## 2023-02-08 DIAGNOSIS — M549 Dorsalgia, unspecified: Secondary | ICD-10-CM | POA: Diagnosis present

## 2023-02-08 DIAGNOSIS — R1011 Right upper quadrant pain: Secondary | ICD-10-CM | POA: Diagnosis not present

## 2023-02-08 DIAGNOSIS — Z79899 Other long term (current) drug therapy: Secondary | ICD-10-CM

## 2023-02-08 DIAGNOSIS — K8000 Calculus of gallbladder with acute cholecystitis without obstruction: Secondary | ICD-10-CM | POA: Diagnosis not present

## 2023-02-08 DIAGNOSIS — F17210 Nicotine dependence, cigarettes, uncomplicated: Secondary | ICD-10-CM | POA: Diagnosis present

## 2023-02-08 DIAGNOSIS — R112 Nausea with vomiting, unspecified: Secondary | ICD-10-CM | POA: Insufficient documentation

## 2023-02-08 DIAGNOSIS — Z8249 Family history of ischemic heart disease and other diseases of the circulatory system: Secondary | ICD-10-CM

## 2023-02-08 LAB — URINALYSIS, ROUTINE W REFLEX MICROSCOPIC
Bilirubin Urine: NEGATIVE
Glucose, UA: NEGATIVE mg/dL
Hgb urine dipstick: NEGATIVE
Ketones, ur: NEGATIVE mg/dL
Nitrite: NEGATIVE
Protein, ur: 30 mg/dL — AB
Specific Gravity, Urine: 1.027 (ref 1.005–1.030)
pH: 6 (ref 5.0–8.0)

## 2023-02-08 LAB — CBC WITH DIFFERENTIAL/PLATELET
Abs Immature Granulocytes: 0.04 10*3/uL (ref 0.00–0.07)
Basophils Absolute: 0.1 10*3/uL (ref 0.0–0.1)
Basophils Relative: 1 %
Eosinophils Absolute: 0.3 10*3/uL (ref 0.0–0.5)
Eosinophils Relative: 3 %
HCT: 41.5 % (ref 36.0–46.0)
Hemoglobin: 13.9 g/dL (ref 12.0–15.0)
Immature Granulocytes: 0 %
Lymphocytes Relative: 26 %
Lymphs Abs: 2.9 10*3/uL (ref 0.7–4.0)
MCH: 31.2 pg (ref 26.0–34.0)
MCHC: 33.5 g/dL (ref 30.0–36.0)
MCV: 93 fL (ref 80.0–100.0)
Monocytes Absolute: 1 10*3/uL (ref 0.1–1.0)
Monocytes Relative: 8 %
Neutro Abs: 7.2 10*3/uL (ref 1.7–7.7)
Neutrophils Relative %: 62 %
Platelets: 433 10*3/uL — ABNORMAL HIGH (ref 150–400)
RBC: 4.46 MIL/uL (ref 3.87–5.11)
RDW: 11.9 % (ref 11.5–15.5)
WBC: 11.5 10*3/uL — ABNORMAL HIGH (ref 4.0–10.5)
nRBC: 0 % (ref 0.0–0.2)

## 2023-02-08 LAB — COMPREHENSIVE METABOLIC PANEL
ALT: 15 U/L (ref 0–44)
AST: 18 U/L (ref 15–41)
Albumin: 4.5 g/dL (ref 3.5–5.0)
Alkaline Phosphatase: 94 U/L (ref 38–126)
Anion gap: 9 (ref 5–15)
BUN: 10 mg/dL (ref 6–20)
CO2: 26 mmol/L (ref 22–32)
Calcium: 9.7 mg/dL (ref 8.9–10.3)
Chloride: 102 mmol/L (ref 98–111)
Creatinine, Ser: 0.64 mg/dL (ref 0.44–1.00)
GFR, Estimated: >60 mL/min (ref >60)
Glucose, Bld: 119 mg/dL — ABNORMAL HIGH (ref 70–99)
Potassium: 3.7 mmol/L (ref 3.5–5.1)
Sodium: 137 mmol/L (ref 135–145)
Total Bilirubin: 0.7 mg/dL (ref 0.0–1.2)
Total Protein: 7.3 g/dL (ref 6.5–8.1)

## 2023-02-08 LAB — PREGNANCY, URINE: Preg Test, Ur: NEGATIVE

## 2023-02-08 LAB — LIPASE, BLOOD: Lipase: 30 U/L (ref 11–51)

## 2023-02-08 MED ORDER — DOCUSATE SODIUM 100 MG PO CAPS
100.0000 mg | ORAL_CAPSULE | Freq: Two times a day (BID) | ORAL | Status: DC
  Administered 2023-02-08 – 2023-02-11 (×5): 100 mg via ORAL
  Filled 2023-02-08 (×5): qty 1

## 2023-02-08 MED ORDER — LACTATED RINGERS IV BOLUS
1000.0000 mL | Freq: Once | INTRAVENOUS | Status: AC
  Administered 2023-02-08: 1000 mL via INTRAVENOUS

## 2023-02-08 MED ORDER — ONDANSETRON HCL 4 MG/2ML IJ SOLN
4.0000 mg | Freq: Four times a day (QID) | INTRAMUSCULAR | Status: DC | PRN
  Administered 2023-02-08 – 2023-02-09 (×2): 4 mg via INTRAVENOUS
  Filled 2023-02-08: qty 2

## 2023-02-08 MED ORDER — FENTANYL CITRATE PF 50 MCG/ML IJ SOSY
50.0000 ug | PREFILLED_SYRINGE | Freq: Once | INTRAMUSCULAR | Status: AC
  Administered 2023-02-08: 50 ug via INTRAVENOUS
  Filled 2023-02-08: qty 1

## 2023-02-08 MED ORDER — PROMETHAZINE HCL 25 MG/ML IJ SOLN
INTRAMUSCULAR | Status: AC
  Filled 2023-02-08: qty 1

## 2023-02-08 MED ORDER — CYCLOBENZAPRINE HCL 10 MG PO TABS
10.0000 mg | ORAL_TABLET | Freq: Three times a day (TID) | ORAL | Status: DC | PRN
  Administered 2023-02-09: 10 mg via ORAL
  Filled 2023-02-08 (×2): qty 1

## 2023-02-08 MED ORDER — IOHEXOL 300 MG/ML  SOLN
100.0000 mL | Freq: Once | INTRAMUSCULAR | Status: AC | PRN
  Administered 2023-02-08: 100 mL via INTRAVENOUS

## 2023-02-08 MED ORDER — ACETAMINOPHEN 325 MG PO TABS
650.0000 mg | ORAL_TABLET | Freq: Four times a day (QID) | ORAL | Status: DC | PRN
  Administered 2023-02-09: 650 mg via ORAL
  Filled 2023-02-08 (×2): qty 2

## 2023-02-08 MED ORDER — HYDROMORPHONE HCL 1 MG/ML IJ SOLN
0.5000 mg | Freq: Once | INTRAMUSCULAR | Status: AC
  Administered 2023-02-08: 0.5 mg via INTRAVENOUS
  Filled 2023-02-08: qty 0.5

## 2023-02-08 MED ORDER — DIPHENHYDRAMINE HCL 12.5 MG/5ML PO ELIX: 12.5000 mg | ORAL_SOLUTION | Freq: Four times a day (QID) | ORAL | Status: DC | PRN

## 2023-02-08 MED ORDER — HYDROMORPHONE HCL 1 MG/ML IJ SOLN
1.0000 mg | Freq: Once | INTRAMUSCULAR | Status: AC
  Administered 2023-02-08: 1 mg via INTRAVENOUS
  Filled 2023-02-08: qty 1

## 2023-02-08 MED ORDER — SODIUM CHLORIDE 0.9 % IV SOLN
20.0000 ug | INTRAVENOUS | Status: AC
  Administered 2023-02-09: 20 ug via INTRAVENOUS
  Filled 2023-02-08: qty 5

## 2023-02-08 MED ORDER — SIMETHICONE 80 MG PO CHEW: 40.0000 mg | CHEWABLE_TABLET | Freq: Four times a day (QID) | ORAL | Status: DC | PRN

## 2023-02-08 MED ORDER — MORPHINE SULFATE (PF) 2 MG/ML IV SOLN
2.0000 mg | INTRAVENOUS | Status: DC | PRN
  Administered 2023-02-08 – 2023-02-10 (×7): 2 mg via INTRAVENOUS
  Filled 2023-02-08 (×7): qty 1

## 2023-02-08 MED ORDER — SODIUM CHLORIDE 0.9 % IV SOLN
12.5000 mg | Freq: Four times a day (QID) | INTRAVENOUS | Status: DC | PRN
  Administered 2023-02-08: 12.5 mg via INTRAVENOUS
  Filled 2023-02-08: qty 0.5

## 2023-02-08 MED ORDER — ONDANSETRON 4 MG PO TBDP: 4.0000 mg | ORAL_TABLET | Freq: Four times a day (QID) | ORAL | Status: DC | PRN

## 2023-02-08 MED ORDER — ACETAMINOPHEN 650 MG RE SUPP: 650.0000 mg | Freq: Four times a day (QID) | RECTAL | Status: DC | PRN

## 2023-02-08 MED ORDER — DIPHENHYDRAMINE HCL 50 MG/ML IJ SOLN: 12.5000 mg | Freq: Four times a day (QID) | INTRAMUSCULAR | Status: DC | PRN

## 2023-02-08 MED ORDER — PANTOPRAZOLE SODIUM 40 MG IV SOLR
40.0000 mg | Freq: Every day | INTRAVENOUS | Status: DC
  Administered 2023-02-08 – 2023-02-10 (×3): 40 mg via INTRAVENOUS
  Filled 2023-02-08 (×3): qty 10

## 2023-02-08 MED ORDER — OXYCODONE HCL 5 MG PO TABS
5.0000 mg | ORAL_TABLET | ORAL | Status: DC | PRN
Start: 2023-02-08 — End: 2023-02-11
  Administered 2023-02-09 (×2): 5 mg via ORAL
  Administered 2023-02-09 – 2023-02-10 (×5): 10 mg via ORAL
  Administered 2023-02-11: 5 mg via ORAL
  Filled 2023-02-08: qty 1
  Filled 2023-02-08 (×3): qty 2
  Filled 2023-02-08: qty 1
  Filled 2023-02-08: qty 2
  Filled 2023-02-08 (×4): qty 1

## 2023-02-08 MED ORDER — ONDANSETRON HCL 4 MG/2ML IJ SOLN
4.0000 mg | Freq: Once | INTRAMUSCULAR | Status: AC
  Administered 2023-02-08: 4 mg via INTRAVENOUS
  Filled 2023-02-08: qty 2

## 2023-02-08 NOTE — ED Notes (Signed)
 EDP at bedside

## 2023-02-08 NOTE — ED Provider Notes (Signed)
 Patient's ultrasound is very reassuring does show gallstones with no evidence of acute cholecystitis and common bile duct is normal.  Patient's urinalysis has some white blood cells but no significant bacteria.  Complete metabolic panel liver function tests are completely normal white count up a little bit 11.5.  Patient is onset of symptoms were around dinner last night she has been having mostly back pain associated with this but is slightly tender to right upper quadrant examination by me.  States the pain never really ever went away.  Will discuss with general surgery Dr. Hillman Luck whether this could possibly be prolonged biliary colic even though the back pain part is a little bit atypical.   Sriman Tally, MD 02/08/23 1059

## 2023-02-08 NOTE — ED Provider Notes (Signed)
 [place] EMERGENCY DEPARTMENT AT [facility] Provider Note   CSN: [id number] Arrival date & time: 02/08/23  0256     History  Chief Complaint  Patient presents with   Flank Pain    Andrea Todd is a 23 y.o. female.  23 year old female who presents ER today with a few hours of right upper quadrant/right lower chest pain.  Patient states that she has been nauseous and throwing up multiple times with that as well.  Started tonight woke her up from sleep.  Last ate around 9:00.  No history of gallbladder issues.  Radiates around towards her back a little bit but not up or down.  No shoulder pain.  Nonbloody nonbilious emesis.  Chills but no fevers.  Feels sweaty.  No urinary symptoms.  No vaginal symptoms.  Last menstrual cycle the beginning of this month.   Flank Pain       Home Medications Prior to Admission medications   Medication Sig Start Date End Date Taking? Authorizing Provider  cholecalciferol (VITAMIN D3) 25 MCG (1000 UNIT) tablet Take 1,000 Units by mouth daily.    [provider]  cyclobenzaprine  (FLEXERIL ) 10 MG tablet Take 1 tablet (10 mg total) by mouth at bedtime as needed. 01/02/23   Del Orbe Polanco, Iliana, FNP  ferrous sulfate 325 (65 FE) MG EC tablet Take 325 mg by mouth daily with breakfast.    [provider]  meloxicam  (MOBIC ) 7.5 MG tablet Take 1 tablet (7.5 mg total) by mouth daily as needed for pain. 01/02/23   Del Orbe Polanco, Iliana, FNP  Multiple Vitamin (MULTIVITAMIN WITH MINERALS) TABS tablet Take 1 tablet by mouth daily.    [provider]  vitamin B-12 (CYANOCOBALAMIN) 100 MCG tablet Take 100 mcg by mouth daily.    [provider]      Allergies    Patient has no known allergies.    Review of Systems   Review of Systems  Genitourinary:  Positive for flank pain.    Physical Exam Updated Vital Signs BP (!) 140/92 (BP Location: Right Arm)   Pulse 91   Temp 97.7 F (36.5 C) (Oral)   Resp 19    Wt 88.5 kg   LMP 01/26/2023 (Approximate)   SpO2 100%   BMI 32.45 kg/m  Physical Exam Vitals and nursing note reviewed.  Constitutional:      Appearance: She is well-developed.  HENT:     Head: Normocephalic and atraumatic.  Cardiovascular:     Rate and Rhythm: Normal rate and regular rhythm.  Pulmonary:     Effort: No respiratory distress.     Breath sounds: No stridor.  Abdominal:     General: There is no distension.     Tenderness: There is abdominal tenderness (Right upper quadrant). There is no guarding or rebound.  Musculoskeletal:     Cervical back: Normal range of motion.  Neurological:     Mental Status: She is alert.     ED Results / Procedures / Treatments   Labs (all labs ordered are listed, but only abnormal results are displayed) Labs Reviewed  URINALYSIS, ROUTINE W REFLEX MICROSCOPIC - Abnormal; Notable for the following components:      Result Value   Color, Urine AMBER (*)    APPearance TURBID (*)    Protein, ur 30 (*)    Leukocytes,Ua SMALL (*)    Bacteria, UA RARE (*)    All other components within normal limits  CBC WITH DIFFERENTIAL/PLATELET -  Abnormal; Notable for the following components:   WBC 11.5 (*)    Platelets 433 (*)    All other components within normal limits  COMPREHENSIVE METABOLIC PANEL - Abnormal; Notable for the following components:   Glucose, Bld 119 (*)    All other components within normal limits  LIPASE, BLOOD  PREGNANCY, URINE    EKG None  Radiology CT ABDOMEN PELVIS W CONTRAST Result Date: 02/08/2023 CLINICAL DATA:  Acute, nonlocalized abdominal pain. Right flank pain now with nausea and vomiting. EXAM: CT ABDOMEN AND PELVIS WITH CONTRAST TECHNIQUE: Multidetector CT imaging of the abdomen and pelvis was performed using the standard protocol following bolus administration of intravenous contrast. RADIATION DOSE REDUCTION: This exam was performed according to the departmental dose-optimization program which includes  automated exposure control, adjustment of the mA and/or kV according to patient size and/or use of iterative reconstruction technique. CONTRAST:  100mL OMNIPAQUE  IOHEXOL  300 MG/ML  SOLN COMPARISON:  None Available. FINDINGS: Lower chest:  No contributory findings. Hepatobiliary: No focal liver abnormality.Cholelithiasis with gas containing gallstones seen in the moderately full gallbladder. No convincing gallbladder wall thickening or pericholecystic edema. There is mild intrahepatic biliary duct prominence for age. No CBD dilatation or calcified stone seen. Pancreas: Unremarkable. Spleen: Unremarkable. Adrenals/Urinary Tract: Negative adrenals. No hydronephrosis or stone. Unremarkable bladder. Stomach/Bowel:  No obstruction. No appendicitis. Vascular/Lymphatic: No acute vascular abnormality. No mass or adenopathy. Reproductive:No pathologic findings. Other: No ascites or pneumoperitoneum. Musculoskeletal: No acute abnormalities. IMPRESSION: Cholelithiasis with mild intrahepatic bile duct dilatation, correlate with right upper quadrant exam and liver labs. Electronically Signed   By: Ronnette Coke M.D.   On: 02/08/2023 05:38    Procedures Procedures    Medications Ordered in ED Medications  promethazine  (PHENERGAN ) 12.5 mg in sodium chloride  0.9 % 50 mL IVPB (12.5 mg Intravenous New Bag/Given 02/08/23 0628)  ondansetron  (ZOFRAN ) injection 4 mg (4 mg Intravenous Given 02/08/23 0426)  lactated ringers  bolus 1,000 mL (0 mLs Intravenous Stopped 02/08/23 0543)  fentaNYL  (SUBLIMAZE ) injection 50 mcg (50 mcg Intravenous Given 02/08/23 0428)  iohexol  (OMNIPAQUE ) 300 MG/ML solution 100 mL (100 mLs Intravenous Contrast Given 02/08/23 0518)  HYDROmorphone  (DILAUDID ) injection 1 mg (1 mg Intravenous Given 02/08/23 6213)    ED Course/ Medical Decision Making/ A&P                                 Medical Decision Making Amount and/or Complexity of Data Reviewed Labs: ordered. Radiology:  ordered.  Risk Prescription drug management. Decision regarding hospitalization.  Initial differential for this was kidney stone versus cholecystitis versus peptic ulcer disease versus pancreatitis, less likely pyelonephritis or appendicitis.  CT scan, labs and symptomatic treatment was given.  Her pain improved briefly with the fentanyl  but then returned.  Nausea did not get much better with Zofran  so Phenergan  was ordered.  Was found to have a leukocytosis but rest of her labs are relatively reassuring.  CT scan on my interpretation showed multiple gallstones mild gallbladder dilation and what appear to be elevated pericholecystic fluid however radiology did not note all of these things however still recommending a ultrasound.  This has been ordered but not available at this time. Care transferred pending ultrasound and likely surgical consultation.   Final Clinical Impression(s) / ED Diagnoses Final diagnoses:  None    Rx / DC Orders ED Discharge Orders     None         Kaleeah Gingerich, Reymundo Caulk,  MD 02/08/23 2315

## 2023-02-08 NOTE — H&P (Signed)
 Expand All Collapse All  I was present with the medical student for this service. I personally verified the history of present illness, performed the physical exam, and made the plan for this encounter. I have verified the medical student's documentation and made modifications where appropriately. I have personally documented in my own words a brief history, physical, and plan below.      Patient with RUQ pain and nausea/vomiting that is intractable. No obvious signs of cholecystitis or choledocholithiasis currently.    PLAN: I counseled the patient about the indication, risks and benefits of robotic assisted laparoscopic cholecystectomy.  She understands there is a very small chance for bleeding, infection, injury to normal structures (including common bile duct), conversion to open surgery, persistent symptoms, evolution of postcholecystectomy diarrhea, need for secondary interventions, anesthesia reaction, cardiopulmonary issues and other risks not specifically detailed here. I described the expected recovery, the plan for follow-up and the restrictions during the recovery phase.  All questions were answered.   Labs in AM before surgery. If changing or concern for any choledocholithiasis, may need to  get MRCP.   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 No prior surgery. Discussed with Dr. Cheree Cords. To be safe with treat with DDAVP  prior to surgery. Pharmacy help with the orders.    Will have her follow up as outpatient with Dr. Katragadda for repeat testing so she will be more informed for any future surgeries/ issues.    Personally reviewed and showed patient and family. Stones and no obvious signs on infection.    Will continue pain and issues  she could be developing early infection or passing a stone.   Deena Farrier, MD [facility] [address]ise Barlow [place], [place] [address] [phone] (office)           Reason for  Consult: Acute Cholecystitis Referring Physician: Nicklas Barns, MD- Emergency Department   Andrea Todd is an 24 y.o. female w/ PMHx of chronic back pain who presented to the ED for right upper abdominal pain starting at 1 am this morning accompanied by nausea, vomiting, subjective febrile, chills, decreased appetite. She states that she first felt fatigued after dinner at 8 pm last night. The patient's pain radiates to her right back and right shoulder. She denies diarrhea, constipation, chest pain, shortness of breath, rash, epigastric abdominal pain, left sided abdominal or back pain, or lower abdominal pain. The patient has not had these symptoms before. She does not take any medications for anything. With medications her nausea improved, and pain slightly improved transiently. 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       Past Medical History:  Diagnosis Date   Blood disorder      Von Willebrands disease   Heavy menstrual bleeding            No past surgical history on file.     No surgery reported.         Family History  Problem Relation Age of Onset   Hypertension Mother     Hypertension Maternal Grandmother     Diabetes Maternal Grandmother     Hypertension Maternal Grandfather     Hypertension Paternal Grandmother     Diabetes Paternal Grandmother     Hypertension Paternal Grandfather            Social History:  reports that she has  been smoking cigarettes. She has never used smokeless tobacco. She reports current drug use. Drug: Marijuana. She reports that she does not drink alcohol.   Allergies:  Allergies  No Known Allergies     Medications: I have reviewed the patient's current medications.          Current Facility-Administered Medications  Medication Dose Route Frequency Provider Last Rate Last Admin   acetaminophen  (TYLENOL ) tablet 650 mg  650 mg  Oral Q6H PRN Awilda Bogus, MD      Or   acetaminophen  (TYLENOL ) suppository 650 mg  650 mg Rectal Q6H PRN Awilda Bogus, MD     cyclobenzaprine  (FLEXERIL ) tablet 10 mg  10 mg Oral TID PRN Awilda Bogus, MD     [START ON 02/09/2023] desmopressin  (DDAVP ) 20 mcg in sodium chloride  0.9 % 50 mL IVPB  20 mcg Intravenous 60 min Pre-Op Awilda Bogus, MD     diphenhydrAMINE  (BENADRYL ) 12.5 MG/5ML elixir 12.5 mg  12.5 mg Oral Q6H PRN Awilda Bogus, MD      Or   diphenhydrAMINE  (BENADRYL ) injection 12.5 mg  12.5 mg Intravenous Q6H PRN Awilda Bogus, MD     docusate sodium  (COLACE) capsule 100 mg  100 mg Oral BID Awilda Bogus, MD     morphine  (PF) 2 MG/ML injection 2 mg  2 mg Intravenous Q3H PRN Awilda Bogus, MD  2 mg at 02/08/23 1457   ondansetron  (ZOFRAN -ODT) disintegrating tablet 4 mg  4 mg Oral Q6H PRN Awilda Bogus, MD      Or   ondansetron  (ZOFRAN ) injection 4 mg  4 mg Intravenous Q6H PRN Awilda Bogus, MD     oxyCODONE  (Oxy IR/ROXICODONE ) immediate release tablet 5-10 mg  5-10 mg Oral Q4H PRN Awilda Bogus, MD     pantoprazole  (PROTONIX ) injection 40 mg  40 mg Intravenous QHS Awilda Bogus, MD     promethazine  (PHENERGAN ) 12.5 mg in sodium chloride  0.9 % 50 mL IVPB  12.5 mg Intravenous Q6H PRN Awilda Bogus, MD     simethicone  (MYLICON) chewable tablet 40 mg  40 mg Oral Q6H PRN Awilda Bogus, MD               Current Outpatient Medications  Medication Sig Dispense Refill Last Dose/Taking   acetaminophen  (TYLENOL ) 325 MG tablet Take 650 mg by mouth every 6 (six) hours as needed for moderate pain (pain score 4-6).     02/08/2023   cholecalciferol (VITAMIN D3) 25 MCG (1000 UNIT) tablet Take 1,000 Units by mouth daily.     Past Week   cyclobenzaprine  (FLEXERIL ) 10 MG tablet Take 1 tablet (10 mg total) by mouth at bedtime as needed. 30 tablet 2 Taking As Needed   ferrous sulfate 325 (65 FE) MG EC tablet Take 325 mg by mouth daily  with breakfast.     Past Week   Multiple Vitamin (MULTIVITAMIN WITH MINERALS) TABS tablet Take 1 tablet by mouth daily.     Past Week   vitamin B-12 (CYANOCOBALAMIN) 100 MCG tablet Take 100 mcg by mouth daily.     Past Week   meloxicam  (MOBIC ) 7.5 MG tablet Take 1 tablet (7.5 mg total) by mouth daily as needed for pain. 30 tablet 1        Lab Results Last 48 Hours        Results for orders placed or performed during the hospital encounter of 02/08/23 (from the past 48 hours)  Urinalysis, Routine w reflex microscopic -Urine, Clean Catch     Status: Abnormal    Collection Time: 02/08/23  3:30 AM  Result Value Ref Range    Color, Urine AMBER (A) YELLOW      Comment: BIOCHEMICALS MAY BE AFFECTED BY COLOR    APPearance TURBID (A) CLEAR    Specific Gravity, Urine 1.027 1.005 - 1.030    pH 6.0 5.0 - 8.0    Glucose, UA NEGATIVE NEGATIVE mg/dL    Hgb urine dipstick NEGATIVE NEGATIVE    Bilirubin Urine NEGATIVE NEGATIVE    Ketones, ur NEGATIVE NEGATIVE mg/dL    Protein, ur 30 (A) NEGATIVE mg/dL    Nitrite NEGATIVE NEGATIVE    Leukocytes,Ua SMALL (A) NEGATIVE    RBC / HPF 6-10 0 - 5 RBC/hpf    WBC, UA 21-50 0 - 5 WBC/hpf    Bacteria, UA RARE (A) NONE SEEN    Squamous Epithelial / HPF 11-20 0 - 5 /HPF    WBC Clumps PRESENT      Mucus PRESENT      Budding Yeast PRESENT        Comment: Performed at [facility], [address], [place], [place] [address]  Pregnancy, urine     Status: None    Collection Time: 02/08/23  3:30 AM  Result Value Ref Range    Preg Test, Ur NEGATIVE NEGATIVE      Comment:        THE SENSITIVITY OF THIS METHODOLOGY IS >25 mIU/mL. Performed at [facility], [address], [place], [place] [address]    CBC with Differential     Status: Abnormal    Collection Time: 02/08/23  3:59 AM  Result Value Ref Range    WBC 11.5 (H) 4.0 - 10.5 K/uL    RBC 4.46 3.87 - 5.11 MIL/uL    Hemoglobin 13.9 12.0 - 15.0 g/dL    HCT 29.5 28.4 - 13.2 %    MCV 93.0 80.0 - 100.0 fL     MCH 31.2 26.0 - 34.0 pg    MCHC 33.5 30.0 - 36.0 g/dL    RDW 44.0 10.2 - 72.5 %    Platelets 433 (H) 150 - 400 K/uL    nRBC 0.0 0.0 - 0.2 %    Neutrophils Relative % 62 %    Neutro Abs 7.2 1.7 - 7.7 K/uL    Lymphocytes Relative 26 %    Lymphs Abs 2.9 0.7 - 4.0 K/uL    Monocytes Relative 8 %    Monocytes Absolute 1.0 0.1 - 1.0 K/uL    Eosinophils Relative 3 %    Eosinophils Absolute 0.3 0.0 - 0.5 K/uL    Basophils Relative 1 %    Basophils Absolute 0.1 0.0 - 0.1 K/uL    Immature Granulocytes 0 %    Abs Immature Granulocytes 0.04 0.00 - 0.07 K/uL      Comment: Performed at [facility], [address], [place], [place] [address]  Comprehensive metabolic panel     Status: Abnormal    Collection Time: 02/08/23  3:59 AM  Result Value Ref Range    Sodium 137 135 - 145 mmol/L    Potassium 3.7 3.5 - 5.1 mmol/L    Chloride 102 98 - 111 mmol/L    CO2 26 22 - 32 mmol/L    Glucose, Bld 119 (H) 70 - 99 mg/dL      Comment: Glucose reference range applies only to samples taken after fasting for at  least 8 hours.    BUN 10 6 - 20 mg/dL    Creatinine, Ser 1.61 0.44 - 1.00 mg/dL    Calcium 9.7 8.9 - 09.6 mg/dL    Total Protein 7.3 6.5 - 8.1 g/dL    Albumin 4.5 3.5 - 5.0 g/dL    AST 18 15 - 41 U/L    ALT 15 0 - 44 U/L    Alkaline Phosphatase 94 38 - 126 U/L    Total Bilirubin 0.7 0.0 - 1.2 mg/dL    GFR, Estimated >04 >54 mL/min      Comment: (NOTE) Calculated using the CKD-EPI Creatinine Equation (2021)      Anion gap 9 5 - 15      Comment: Performed at [facility], [address] [place], [place] [address]  Lipase, blood     Status: None    Collection Time: 02/08/23  3:59 AM  Result Value Ref Range    Lipase 30 11 - 51 U/L      Comment: Performed at [facility], [address], [place], [place] [address]         Imaging Results (Last 48 hours)  US  Abdomen Limited RUQ (LIVER/GB) Result Date: 02/08/2023 CLINICAL DATA:  Pain EXAM: ULTRASOUND ABDOMEN LIMITED RIGHT UPPER QUADRANT  COMPARISON:  CT earlier 02/08/2023 FINDINGS: Gallbladder: Distended gallbladder. Multiple shadowing stones. No wall thickening or adjacent fluid. Common bile duct: Diameter: 4 mm Liver: No focal lesion identified. Within normal limits in parenchymal echogenicity. Portal vein is patent on color Doppler imaging with normal direction of blood flow towards the liver. Other: None. IMPRESSION: Distended gallbladder with multiple stones. No wall thickening or adjacent fluid or ductal dilatation. No further sonographic evidence of acute cholecystitis at this time. Further evaluation as clinically appropriate Electronically Signed   By: Adrianna Horde M.D.   On: 02/08/2023 10:04    CT ABDOMEN PELVIS W CONTRAST Result Date: 02/08/2023 CLINICAL DATA:  Acute, nonlocalized abdominal pain. Right flank pain now with nausea and vomiting. EXAM: CT ABDOMEN AND PELVIS WITH CONTRAST TECHNIQUE: Multidetector CT imaging of the abdomen and pelvis was performed using the standard protocol following bolus administration of intravenous contrast. RADIATION DOSE REDUCTION: This exam was performed according to the departmental dose-optimization program which includes automated exposure control, adjustment of the mA and/or kV according to patient size and/or use of iterative reconstruction technique. CONTRAST:  OMNIPAQUE  IOHEXOL  300 MG/ML  SOLN COMPARISON:  None Available. FINDINGS: Lower chest:  No contributory findings. Hepatobiliary: No focal liver abnormality.Cholelithiasis with gas containing gallstones seen in the moderately full gallbladder. No convincing gallbladder wall thickening or pericholecystic edema. There is mild intrahepatic biliary duct prominence for age. No CBD dilatation or calcified stone seen. Pancreas: Unremarkable. Spleen: Unremarkable. Adrenals/Urinary Tract: Negative adrenals. No hydronephrosis or stone. Unremarkable bladder. Stomach/Bowel:  No obstruction. No appendicitis. Vascular/Lymphatic: No acute vascular  abnormality. No mass or adenopathy. Reproductive:No pathologic findings. Other: No ascites or pneumoperitoneum. Musculoskeletal: No acute abnormalities. IMPRESSION: Cholelithiasis with mild intrahepatic bile duct dilatation, correlate with right upper quadrant exam and liver labs. Electronically Signed   By: Ronnette Coke M.D.   On: 02/08/2023 05:38       ROS:  Pertinent items noted in HPI and remainder of comprehensive ROS otherwise negative.   Blood pressure (!) 132/92, pulse 84, temperature 97.8 F (36.6 C), temperature source Oral, resp. rate 15, weight 88.5 kg, last menstrual period 01/26/2023, SpO2 97%.   Physical Exam Vitals reviewed.  Constitutional:  Comments: Appears uncomfortable  HENT:     Head: Normocephalic.     Ears:     Comments: Hearing grossly normal Eyes:     Comments: Eye movements grossly normal  Cardiovascular:     Rate and Rhythm: Normal rate and regular rhythm.     Comments: Heart rate of 68 Pulmonary:     Effort: Pulmonary effort is normal.     Breath sounds: Normal breath sounds.  Abdominal:     General: Abdomen is flat. There is no distension.     Palpations: There is no mass.     Tenderness: There is abdominal tenderness (RUQ). There is no right CVA tenderness, left CVA tenderness, guarding or rebound.     Comments: Mild pain to RUQ with sitting up in bed  Musculoskeletal:        General: No tenderness. Normal range of motion.     Comments: Able to move all four extremities  Skin:    General: Skin is warm.     Capillary Refill: Capillary refill takes less than 2 seconds.  Neurological:     General: No focal deficit present.     Mental Status: She is alert and oriented to person, place, and time.  Psychiatric:        Mood and Affect: Mood normal.        Behavior: Behavior normal.        Assessment/Plan: Linzey Todd is a 23 year old female w/ PMHx of chronic back pain who presents to the ED for RUQ abdominal pain, nausea, vomiting since  1 am for whom we were consulted to evaluate for cholecystitis and surgical candidacy.   Biliary Colic  Symptomatic Cholelithiasis Cholelithiasis on CT, subsequent ultrasound consistent; no wall thickening or surrounding inflammation. Her pain is significant and typical for biliary colic, and she is tender in RUQ. WBC 11.5, afebrile, not tachycardic. No liver or pancreatic involvement on labs. This is her first episode. We had an extensive discussion on the risks and benefits of surgery vs observation and medical management and patient would like surgery. We can arrange for surgery tomorrow 02/09/23 due to schedule limitations today. - Laparoscopic robotic cholecystectomy - NPO at midnight, thin liquids today - Zofran  4 mg q4h PRN, phenergan  12.5 mg q6h for refractory nausea - Tiered pain management; Tylenol  scheduled, oxycodone  or morphine  PRN   Chronic Back Pain Her back pain is mid-lower and bilateral and feels different than current pain. She is followed for this and has gone to/goes to physical therapy. I do not suspect that this pain is related to her current constellation of symptoms. - Flexeril  10 mg TID PRN   Dufm Gibbon 02/08/2023, 12:20 PM

## 2023-02-08 NOTE — ED Triage Notes (Signed)
 Pt c/o right sided flank pain that woke her up around 0100. Pain has gotten worse, now having N/V.

## 2023-02-08 NOTE — Discharge Instructions (Addendum)
Discharge Robotic Assisted Laparoscopic Surgery Instructions:  Common Complaints: Right shoulder pain is common after laparoscopic surgery.  This is secondary to the gas used in the surgery being trapped under the diaphragm.  Walk to help your body absorb the gas. This will improve in a few days. Pain at the port sites are common, especially the larger port sites. This will improve with time.  Some nausea is common and poor appetite. The main goal is to stay hydrated the first few days after surgery.   Diet/ Activity: Diet as tolerated. You may not have an appetite, but it is important to stay hydrated.  Drink 64 ounces of water a day. Your appetite will return with time.  Bandages can be removed after 02/11/2023. You can shower. Do not pick at your staples. Pat the area clean.   Rest and listen to your body, but do not remain in bed all day.  Walk everyday for at least 15-20 minutes. Deep cough and move around every 1-2 hours in the first few days after surgery.  Do not lift > 10 lbs, perform excessive bending, pushing, pulling, squatting for 1-2 weeks after surgery.  Do not place lotions or balms on your incision unless instructed to specifically by Dr. Henreitta Leber.   Pain Expectations and Narcotics: -After surgery you will have pain associated with your incisions and this is normal. The pain is muscular and nerve pain, and will get better with time. -You are encouraged and expected to take non narcotic medications like tylenol and ibuprofen (when able) to treat pain as multiple modalities can aid with pain treatment. -Narcotics are only used when pain is severe or there is breakthrough pain. -You are not expected to have a pain score of 0 after surgery, as we cannot prevent pain. A pain score of 3-4 that allows you to be functional, move, walk, and tolerate some activity is the goal. The pain will continue to improve over the days after surgery and is dependent on your surgery. -Due to [place] law,  we are only able to give a certain amount of pain medication to treat post operative pain, and we only give additional narcotics on a patient by patient basis.  -For most laparoscopic surgery, studies have shown that the majority of patients only need 10-15 narcotic pills, and for open surgeries most patients only need 15-20.   -Having appropriate expectations of pain and knowledge of pain management with non narcotics is important as we do not want anyone to become addicted to narcotic pain medication.  -Using ice packs in the first 48 hours and heating pads after 48 hours, wearing an abdominal binder (when recommended), and using over the counter medications are all ways to help with pain management.   -Simple acts like meditation and mindfulness practices after surgery can also help with pain control and research has proven the benefit of these practices.  Medication: Take tylenol and ibuprofen as needed for pain control, alternating every 4-6 hours.  Example:  Tylenol 1000mg  @ 6am, 12noon, 6pm, (Do not exceed 4000mg  of tylenol a day). Ibuprofen 800mg  @ 9am, 3pm, 9pm, 3am (Do not exceed 3600mg  of ibuprofen a day).  Take Roxicodone for breakthrough pain every 4 hours.  Take Colace for constipation related to narcotic pain medication. If you do not have a bowel movement in 2 days, take Miralax over the counter.  Drink plenty of water to also prevent constipation.   Contact Information: If you have questions or concerns, please call our  office, [phone], Monday- Thursday 8AM-5PM and Friday 8AM-12Noon.  If it is after hours or on the weekend, please call [facility] Main Number, [phone], [phone], and ask to speak to the surgeon on call for Dr. Henreitta Leber at [facility].

## 2023-02-08 NOTE — Progress Notes (Signed)
   02/08/23 1521  TOC Brief Assessment  Insurance and Status Reviewed  Patient has primary care physician Yes  Home environment has been reviewed Single Family Home  Prior level of function: Independent  Prior/Current Home Services No current home services  Social Drivers of Health Review SDOH reviewed no interventions necessary  Readmission risk has been reviewed Yes  Transition of care needs no transition of care needs at this time     Transition of Care Department [facility]) has reviewed patient and no TOC needs have been identified at this time. We will continue to monitor patient advancement through interdisciplinary progression rounds. If new patient transition needs arise, please place a TOC consult.

## 2023-02-08 NOTE — Consult Note (Addendum)
 I was present with the medical student for this service. I personally verified the history of present illness, performed the physical exam, and made the plan for this encounter. I have verified the medical student's documentation and made modifications where appropriately. I have personally documented in my own words a brief history, physical, and plan below.     Patient with RUQ pain and nausea/vomiting that is intractable. No obvious signs of cholecystitis or choledocholithiasis currently.   PLAN: I counseled the patient about the indication, risks and benefits of robotic assisted laparoscopic cholecystectomy.  She understands there is a very small chance for bleeding, infection, injury to normal structures (including common bile duct), conversion to open surgery, persistent symptoms, evolution of postcholecystectomy diarrhea, need for secondary interventions, anesthesia reaction, cardiopulmonary issues and other risks not specifically detailed here. I described the expected recovery, the plan for follow-up and the restrictions during the recovery phase.  All questions were answered.  Labs in AM before surgery. If changing or concern for any choledocholithiasis, may need to  get MRCP.  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 No prior surgery. Discussed with Dr. Cheree Cords. To be safe with treat with DDAVP  prior to surgery. Pharmacy help with the orders.   Will have her follow up as outpatient with Dr. Katragadda for repeat testing so she will be more informed for any future surgeries/ issues.   Personally reviewed and showed patient and family. Stones and no obvious signs on infection.   Will continue pain and issues  she could be developing early infection or passing a stone.  Andrea Farrier, MD [facility] [address] [place], [place] [address] [phone] (office)      Reason for Consult: Acute Cholecystitis Referring  Physician: Nicklas Barns, MD- Emergency Department  Andrea Todd is an 23 y.o. female w/ PMHx of chronic back pain who presented to the ED for right upper abdominal pain starting at 1 am this morning accompanied by nausea, vomiting, subjective febrile, chills, decreased appetite. She states that she first felt fatigued after dinner at 8 pm last night. The patient's pain radiates to her right back and right shoulder. She denies diarrhea, constipation, chest pain, shortness of breath, rash, epigastric abdominal pain, left sided abdominal or back pain, or lower abdominal pain. The patient has not had these symptoms before. She does not take any medications for anything. With medications her nausea improved, and pain slightly improved transiently. 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  Past Medical History:  Diagnosis Date   Blood disorder    Von Willebrands disease   Heavy menstrual bleeding     No past surgical history on file. No surgery reported.   Family History  Problem Relation Age of Onset   Hypertension Mother    Hypertension Maternal Grandmother    Diabetes Maternal Grandmother    Hypertension Maternal Grandfather    Hypertension Paternal Grandmother    Diabetes Paternal Grandmother    Hypertension Paternal Grandfather     Social History:  reports that she has been smoking cigarettes. She has never used smokeless tobacco. She reports current drug use. Drug: Marijuana. She reports that she does not drink alcohol.  Allergies: No Known Allergies  Medications: I have reviewed the patient's current medications. Current Facility-Administered Medications  Medication Dose Route Frequency Provider Last Rate Last Admin   acetaminophen  (TYLENOL ) tablet  650 mg  650 mg Oral Q6H PRN Awilda Bogus, MD       Or   acetaminophen  (TYLENOL ) suppository 650 mg  650 mg Rectal Q6H PRN  Awilda Bogus, MD       cyclobenzaprine  (FLEXERIL ) tablet 10 mg  10 mg Oral TID PRN Awilda Bogus, MD       [START ON 02/09/2023] desmopressin  (DDAVP ) 20 mcg in sodium chloride  0.9 % 50 mL IVPB  20 mcg Intravenous 60 min Pre-Op Awilda Bogus, MD       diphenhydrAMINE  (BENADRYL ) 12.5 MG/5ML elixir 12.5 mg  12.5 mg Oral Q6H PRN Awilda Bogus, MD       Or   diphenhydrAMINE  (BENADRYL ) injection 12.5 mg  12.5 mg Intravenous Q6H PRN Awilda Bogus, MD       docusate sodium  (COLACE) capsule 100 mg  100 mg Oral BID Awilda Bogus, MD       morphine  (PF) 2 MG/ML injection 2 mg  2 mg Intravenous Q3H PRN Awilda Bogus, MD   2 mg at 02/08/23 1457   ondansetron  (ZOFRAN -ODT) disintegrating tablet 4 mg  4 mg Oral Q6H PRN Awilda Bogus, MD       Or   ondansetron  (ZOFRAN ) injection 4 mg  4 mg Intravenous Q6H PRN Awilda Bogus, MD       oxyCODONE  (Oxy IR/ROXICODONE ) immediate release tablet 5-10 mg  5-10 mg Oral Q4H PRN Awilda Bogus, MD       pantoprazole  (PROTONIX ) injection 40 mg  40 mg Intravenous QHS Awilda Bogus, MD       promethazine  (PHENERGAN ) 12.5 mg in sodium chloride  0.9 % 50 mL IVPB  12.5 mg Intravenous Q6H PRN Awilda Bogus, MD       simethicone  [facility]) chewable tablet 40 mg  40 mg Oral Q6H PRN Awilda Bogus, MD       Current Outpatient Medications  Medication Sig Dispense Refill Last Dose/Taking   acetaminophen  (TYLENOL ) 325 MG tablet Take 650 mg by mouth every 6 (six) hours as needed for moderate pain (pain score 4-6).   02/08/2023   cholecalciferol (VITAMIN D3) 25 MCG (1000 UNIT) tablet Take 1,000 Units by mouth daily.   Past Week   cyclobenzaprine  (FLEXERIL ) 10 MG tablet Take 1 tablet (10 mg total) by mouth at bedtime as needed. 30 tablet 2 Taking As Needed   ferrous sulfate 325 (65 FE) MG EC tablet Take 325 mg by mouth daily with breakfast.   Past Week   Multiple Vitamin (MULTIVITAMIN WITH MINERALS) TABS tablet Take 1 tablet by  mouth daily.   Past Week   vitamin B-12 (CYANOCOBALAMIN) 100 MCG tablet Take 100 mcg by mouth daily.   Past Week   meloxicam  (MOBIC ) 7.5 MG tablet Take 1 tablet (7.5 mg total) by mouth daily as needed for pain. 30 tablet 1     Results for orders placed or performed during the hospital encounter of 02/08/23 (from the past 48 hours)  Urinalysis, Routine w reflex microscopic -Urine, Clean Catch     Status: Abnormal   Collection Time: 02/08/23  3:30 AM  Result Value Ref Range   Color, Urine AMBER (A) YELLOW    Comment: BIOCHEMICALS MAY BE AFFECTED BY COLOR   APPearance TURBID (A) CLEAR   Specific Gravity, Urine 1.027 1.005 - 1.030   pH 6.0 5.0 - 8.0   Glucose, UA NEGATIVE NEGATIVE mg/dL   Hgb urine dipstick NEGATIVE NEGATIVE   Bilirubin  Urine NEGATIVE NEGATIVE   Ketones, ur NEGATIVE NEGATIVE mg/dL   Protein, ur 30 (A) NEGATIVE mg/dL   Nitrite NEGATIVE NEGATIVE   Leukocytes,Ua SMALL (A) NEGATIVE   RBC / HPF 6-10 0 - 5 RBC/hpf   WBC, UA 21-50 0 - 5 WBC/hpf   Bacteria, UA RARE (A) NONE SEEN   Squamous Epithelial / HPF 11-20 0 - 5 /HPF   WBC Clumps PRESENT    Mucus PRESENT    Budding Yeast PRESENT     Comment: Performed at [facility], [address], [place], [place] [address]  Pregnancy, urine     Status: None   Collection Time: 02/08/23  3:30 AM  Result Value Ref Range   Preg Test, Ur NEGATIVE NEGATIVE    Comment:        THE SENSITIVITY OF THIS METHODOLOGY IS >25 mIU/mL. Performed at [facility], [address], [place], [place] [address]   CBC with Differential     Status: Abnormal   Collection Time: 02/08/23  3:59 AM  Result Value Ref Range   WBC 11.5 (H) 4.0 - 10.5 K/uL   RBC 4.46 3.87 - 5.11 MIL/uL   Hemoglobin 13.9 12.0 - 15.0 g/dL   HCT 09.8 11.9 - 14.7 %   MCV 93.0 80.0 - 100.0 fL   MCH 31.2 26.0 - 34.0 pg   MCHC 33.5 30.0 - 36.0 g/dL   RDW 82.9 56.2 - 13.0 %   Platelets 433 (H) 150 - 400 K/uL   nRBC 0.0 0.0 - 0.2 %   Neutrophils Relative % 62 %   Neutro Abs 7.2  1.7 - 7.7 K/uL   Lymphocytes Relative 26 %   Lymphs Abs 2.9 0.7 - 4.0 K/uL   Monocytes Relative 8 %   Monocytes Absolute 1.0 0.1 - 1.0 K/uL   Eosinophils Relative 3 %   Eosinophils Absolute 0.3 0.0 - 0.5 K/uL   Basophils Relative 1 %   Basophils Absolute 0.1 0.0 - 0.1 K/uL   Immature Granulocytes 0 %   Abs Immature Granulocytes 0.04 0.00 - 0.07 K/uL    Comment: Performed at [facility], [address], [place], [place] [address]  Comprehensive metabolic panel     Status: Abnormal   Collection Time: 02/08/23  3:59 AM  Result Value Ref Range   Sodium 137 135 - 145 mmol/L   Potassium 3.7 3.5 - 5.1 mmol/L   Chloride 102 98 - 111 mmol/L   CO2 26 22 - 32 mmol/L   Glucose, Bld 119 (H) 70 - 99 mg/dL    Comment: Glucose reference range applies only to samples taken after fasting for at least 8 hours.   BUN 10 6 - 20 mg/dL   Creatinine, Ser 4.69 0.44 - 1.00 mg/dL   Calcium 9.7 8.9 - 62.9 mg/dL   Total Protein 7.3 6.5 - 8.1 g/dL   Albumin 4.5 3.5 - 5.0 g/dL   AST 18 15 - 41 U/L   ALT 15 0 - 44 U/L   Alkaline Phosphatase 94 38 - 126 U/L   Total Bilirubin 0.7 0.0 - 1.2 mg/dL   GFR, Estimated >52 >84 mL/min    Comment: (NOTE) Calculated using the CKD-EPI Creatinine Equation (2021)    Anion gap 9 5 - 15    Comment: Performed at [facility], [address] [place], [place] [address]  Lipase, blood     Status: None   Collection Time: 02/08/23  3:59 AM  Result Value Ref Range   Lipase  30 11 - 51 U/L    Comment: Performed at [facility], [address], [place], [place] [address]    US  Abdomen Limited RUQ (LIVER/GB) Result Date: 02/08/2023 CLINICAL DATA:  Pain EXAM: ULTRASOUND ABDOMEN LIMITED RIGHT UPPER QUADRANT COMPARISON:  CT earlier 02/08/2023 FINDINGS: Gallbladder: Distended gallbladder. Multiple shadowing stones. No wall thickening or adjacent fluid. Common bile duct: Diameter: 4 mm Liver: No focal lesion identified. Within normal limits in parenchymal echogenicity. Portal vein is  patent on color Doppler imaging with normal direction of blood flow towards the liver. Other: None. IMPRESSION: Distended gallbladder with multiple stones. No wall thickening or adjacent fluid or ductal dilatation. No further sonographic evidence of acute cholecystitis at this time. Further evaluation as clinically appropriate Electronically Signed   By: Adrianna Horde M.D.   On: 02/08/2023 10:04   CT ABDOMEN PELVIS W CONTRAST Result Date: 02/08/2023 CLINICAL DATA:  Acute, nonlocalized abdominal pain. Right flank pain now with nausea and vomiting. EXAM: CT ABDOMEN AND PELVIS WITH CONTRAST TECHNIQUE: Multidetector CT imaging of the abdomen and pelvis was performed using the standard protocol following bolus administration of intravenous contrast. RADIATION DOSE REDUCTION: This exam was performed according to the departmental dose-optimization program which includes automated exposure control, adjustment of the mA and/or kV according to patient size and/or use of iterative reconstruction technique. CONTRAST:  100mL OMNIPAQUE  IOHEXOL  300 MG/ML  SOLN COMPARISON:  None Available. FINDINGS: Lower chest:  No contributory findings. Hepatobiliary: No focal liver abnormality.Cholelithiasis with gas containing gallstones seen in the moderately full gallbladder. No convincing gallbladder wall thickening or pericholecystic edema. There is mild intrahepatic biliary duct prominence for age. No CBD dilatation or calcified stone seen. Pancreas: Unremarkable. Spleen: Unremarkable. Adrenals/Urinary Tract: Negative adrenals. No hydronephrosis or stone. Unremarkable bladder. Stomach/Bowel:  No obstruction. No appendicitis. Vascular/Lymphatic: No acute vascular abnormality. No mass or adenopathy. Reproductive:No pathologic findings. Other: No ascites or pneumoperitoneum. Musculoskeletal: No acute abnormalities. IMPRESSION: Cholelithiasis with mild intrahepatic bile duct dilatation, correlate with right upper quadrant exam and liver  labs. Electronically Signed   By: Ronnette Coke M.D.   On: 02/08/2023 05:38    ROS:  Pertinent items noted in HPI and remainder of comprehensive ROS otherwise negative.  Blood pressure (!) 132/92, pulse 84, temperature 97.8 F (36.6 C), temperature source Oral, resp. rate 15, weight 88.5 kg, last menstrual period 01/26/2023, SpO2 97%.  Physical Exam Vitals reviewed.  Constitutional:      Comments: Appears uncomfortable  HENT:     Head: Normocephalic.     Ears:     Comments: Hearing grossly normal Eyes:     Comments: Eye movements grossly normal  Cardiovascular:     Rate and Rhythm: Normal rate and regular rhythm.     Comments: Heart rate of 68 Pulmonary:     Effort: Pulmonary effort is normal.     Breath sounds: Normal breath sounds.  Abdominal:     General: Abdomen is flat. There is no distension.     Palpations: There is no mass.     Tenderness: There is abdominal tenderness (RUQ). There is no right CVA tenderness, left CVA tenderness, guarding or rebound.     Comments: Mild pain to RUQ with sitting up in bed  Musculoskeletal:        General: No tenderness. Normal range of motion.     Comments: Able to move all four extremities  Skin:    General: Skin is warm.     Capillary Refill: Capillary refill takes less than 2 seconds.  Neurological:     General: No focal deficit present.     Mental Status: She is alert and oriented to person, place, and time.  Psychiatric:        Mood and Affect: Mood normal.        Behavior: Behavior normal.     Assessment/Plan: Andrea Todd is a 23 year old female w/ PMHx of chronic back pain who presents to the ED for RUQ abdominal pain, nausea, vomiting since 1 am for whom we were consulted to evaluate for cholecystitis and surgical candidacy.  Biliary Colic  Symptomatic Cholelithiasis Cholelithiasis on CT, subsequent ultrasound consistent; no wall thickening or surrounding inflammation. Her pain is significant and typical for biliary  colic, and she is tender in RUQ. WBC 11.5, afebrile, not tachycardic. No liver or pancreatic involvement on labs. This is her first episode. We had an extensive discussion on the risks and benefits of surgery vs observation and medical management and patient would like surgery. We can arrange for surgery tomorrow 02/09/23 due to schedule limitations today. - Laparoscopic robotic cholecystectomy - NPO at midnight, thin liquids today - Zofran  4 mg q4h PRN, phenergan  12.5 mg q6h for refractory nausea - Tiered pain management; Tylenol  scheduled, oxycodone  or morphine  PRN  Chronic Back Pain Her back pain is mid-lower and bilateral and feels different than current pain. She is followed for this and has gone to/goes to physical therapy. I do not suspect that this pain is related to her current constellation of symptoms. - Flexeril  10 mg TID PRN  Dufm Gibbon 02/08/2023, 12:20 PM

## 2023-02-08 NOTE — ED Notes (Signed)
 Updated pt and family. Picked up room and assisted patient in getting comfortable in bed. Pt expressed no needs at this time. Call bell is within reach

## 2023-02-09 ENCOUNTER — Observation Stay (HOSPITAL_COMMUNITY): Payer: Managed Care, Other (non HMO) | Admitting: Anesthesiology

## 2023-02-09 ENCOUNTER — Encounter (HOSPITAL_COMMUNITY)
Admission: EM | Disposition: A | Discharge status: Home / Self Care | Payer: Self-pay | Source: Home / Self Care | Attending: General Surgery

## 2023-02-09 ENCOUNTER — Other Ambulatory Visit: Payer: Self-pay

## 2023-02-09 ENCOUNTER — Encounter (HOSPITAL_COMMUNITY): Payer: Self-pay | Admitting: General Surgery

## 2023-02-09 DIAGNOSIS — K810 Acute cholecystitis: Secondary | ICD-10-CM

## 2023-02-09 LAB — COMPREHENSIVE METABOLIC PANEL
ALT: 39 U/L (ref 0–44)
AST: 28 U/L (ref 15–41)
Albumin: 4.2 g/dL (ref 3.5–5.0)
Alkaline Phosphatase: 94 U/L (ref 38–126)
Anion gap: 9 (ref 5–15)
BUN: 6 mg/dL (ref 6–20)
CO2: 29 mmol/L (ref 22–32)
Calcium: 9.2 mg/dL (ref 8.9–10.3)
Chloride: 99 mmol/L (ref 98–111)
Creatinine, Ser: 0.6 mg/dL (ref 0.44–1.00)
GFR, Estimated: >60 mL/min (ref >60)
Glucose, Bld: 100 mg/dL — ABNORMAL HIGH (ref 70–99)
Potassium: 3.6 mmol/L (ref 3.5–5.1)
Sodium: 137 mmol/L (ref 135–145)
Total Bilirubin: 1.2 mg/dL (ref 0.0–1.2)
Total Protein: 7.3 g/dL (ref 6.5–8.1)

## 2023-02-09 SURGERY — CHOLECYSTECTOMY, ROBOT-ASSISTED, LAPAROSCOPIC
Anesthesia: General | Site: Abdomen

## 2023-02-09 MED ORDER — METOPROLOL TARTRATE 5 MG/5ML IV SOLN
INTRAVENOUS | Status: DC | PRN
  Administered 2023-02-09: 2 mg via INTRAVENOUS
  Administered 2023-02-09 (×3): 1 mg via INTRAVENOUS

## 2023-02-09 MED ORDER — INDOCYANINE GREEN 25 MG IV SOLR
INTRAVENOUS | Status: AC
  Administered 2023-02-09: 2.5 mg via INTRAVENOUS
  Filled 2023-02-09: qty 10

## 2023-02-09 MED ORDER — DEXAMETHASONE SODIUM PHOSPHATE 10 MG/ML IJ SOLN
INTRAMUSCULAR | Status: AC
  Filled 2023-02-09: qty 1

## 2023-02-09 MED ORDER — MIDAZOLAM HCL 2 MG/2ML IJ SOLN: 1.0000 mg | INTRAMUSCULAR | Status: DC | PRN

## 2023-02-09 MED ORDER — PROPOFOL 10 MG/ML IV BOLUS
INTRAVENOUS | Status: DC | PRN
  Administered 2023-02-09: 200 mg via INTRAVENOUS

## 2023-02-09 MED ORDER — AMOXICILLIN-POT CLAVULANATE 875-125 MG PO TABS
1.0000 | ORAL_TABLET | Freq: Two times a day (BID) | ORAL | 0 refills | Status: AC
Start: 2023-02-09 — End: 2023-02-14

## 2023-02-09 MED ORDER — ROCURONIUM BROMIDE 100 MG/10ML IV SOLN
INTRAVENOUS | Status: DC | PRN
  Administered 2023-02-09: 70 mg via INTRAVENOUS

## 2023-02-09 MED ORDER — ONDANSETRON 4 MG PO TBDP: 4.0000 mg | ORAL_TABLET | Freq: Four times a day (QID) | ORAL | 0 refills | Status: DC | PRN

## 2023-02-09 MED ORDER — DEXMEDETOMIDINE HCL IN NACL 80 MCG/20ML IV SOLN
INTRAVENOUS | Status: DC | PRN
  Administered 2023-02-09 (×2): 20 ug via INTRAVENOUS

## 2023-02-09 MED ORDER — MIDAZOLAM HCL 2 MG/2ML IJ SOLN
INTRAMUSCULAR | Status: AC
Start: 2023-02-09 — End: ?
  Filled 2023-02-09: qty 2

## 2023-02-09 MED ORDER — OXYCODONE HCL 5 MG PO TABS
5.0000 mg | ORAL_TABLET | Freq: Once | ORAL | Status: AC | PRN
  Administered 2023-02-09: 5 mg via ORAL

## 2023-02-09 MED ORDER — LACTATED RINGERS IV SOLN: INTRAVENOUS | Status: AC

## 2023-02-09 MED ORDER — FENTANYL CITRATE (PF) 250 MCG/5ML IJ SOLN
INTRAMUSCULAR | Status: AC
  Filled 2023-02-09: qty 5

## 2023-02-09 MED ORDER — STERILE WATER FOR IRRIGATION IR SOLN
Status: DC | PRN
  Administered 2023-02-09: 500 mL

## 2023-02-09 MED ORDER — HEMOSTATIC AGENTS (NO CHARGE) OPTIME
TOPICAL | Status: DC | PRN
  Administered 2023-02-09: 1 via TOPICAL

## 2023-02-09 MED ORDER — ROCURONIUM BROMIDE 10 MG/ML (PF) SYRINGE
PREFILLED_SYRINGE | INTRAVENOUS | Status: AC
Start: 2023-02-09 — End: ?
  Filled 2023-02-09: qty 10

## 2023-02-09 MED ORDER — HYDROMORPHONE HCL 1 MG/ML IJ SOLN
0.2500 mg | INTRAMUSCULAR | Status: DC | PRN
  Administered 2023-02-09 (×2): 0.5 mg via INTRAVENOUS
  Filled 2023-02-09 (×2): qty 0.5

## 2023-02-09 MED ORDER — MIDAZOLAM HCL 2 MG/2ML IJ SOLN
2.0000 mg | INTRAMUSCULAR | Status: DC | PRN
  Administered 2023-02-09 (×2): 2 mg via INTRAVENOUS

## 2023-02-09 MED ORDER — CHLORHEXIDINE GLUCONATE 0.12 % MT SOLN: 15.0000 mL | Freq: Once | OROMUCOSAL | Status: AC

## 2023-02-09 MED ORDER — OXYCODONE HCL 5 MG/5ML PO SOLN
5.0000 mg | Freq: Once | ORAL | Status: AC | PRN
Start: 2023-02-09 — End: 2023-02-09

## 2023-02-09 MED ORDER — CEFOTETAN DISODIUM 2 G IJ SOLR
2.0000 g | INTRAMUSCULAR | Status: AC
  Administered 2023-02-09: 2 g via INTRAVENOUS

## 2023-02-09 MED ORDER — PROMETHAZINE HCL 25 MG RE SUPP: 25.0000 mg | Freq: Once | RECTAL | Status: DC | PRN

## 2023-02-09 MED ORDER — ONDANSETRON HCL 4 MG/2ML IJ SOLN
INTRAMUSCULAR | Status: AC
  Filled 2023-02-09: qty 2

## 2023-02-09 MED ORDER — PROPOFOL 10 MG/ML IV BOLUS
INTRAVENOUS | Status: AC
  Filled 2023-02-09: qty 20

## 2023-02-09 MED ORDER — MIDAZOLAM HCL 2 MG/2ML IJ SOLN
INTRAMUSCULAR | Status: AC
  Filled 2023-02-09: qty 2

## 2023-02-09 MED ORDER — PIPERACILLIN-TAZOBACTAM 3.375 G IVPB
3.3750 g | Freq: Three times a day (TID) | INTRAVENOUS | Status: DC
  Administered 2023-02-09 – 2023-02-11 (×5): 3.375 g via INTRAVENOUS
  Filled 2023-02-09 (×8): qty 50

## 2023-02-09 MED ORDER — SCOPOLAMINE 1 MG/3DAYS TD PT72
MEDICATED_PATCH | TRANSDERMAL | Status: AC
  Filled 2023-02-09: qty 1

## 2023-02-09 MED ORDER — ORAL CARE MOUTH RINSE: 15.0000 mL | Freq: Once | OROMUCOSAL | Status: AC

## 2023-02-09 MED ORDER — OXYCODONE HCL 5 MG PO TABS: 5.0000 mg | ORAL_TABLET | ORAL | 0 refills | Status: DC | PRN

## 2023-02-09 MED ORDER — SODIUM CHLORIDE 0.9 % IV SOLN: INTRAVENOUS | Status: DC

## 2023-02-09 MED ORDER — METOPROLOL TARTRATE 5 MG/5ML IV SOLN
INTRAVENOUS | Status: AC
Start: 2023-02-09 — End: ?
  Filled 2023-02-09: qty 5

## 2023-02-09 MED ORDER — LACTATED RINGERS IV SOLN: INTRAVENOUS | Status: DC

## 2023-02-09 MED ORDER — SODIUM CHLORIDE 0.9 % IV SOLN
INTRAVENOUS | Status: AC
  Filled 2023-02-09: qty 2

## 2023-02-09 MED ORDER — PHENYLEPHRINE HCL (PRESSORS) 10 MG/ML IV SOLN
INTRAVENOUS | Status: DC | PRN
  Administered 2023-02-09 (×5): 160 ug via INTRAVENOUS

## 2023-02-09 MED ORDER — DEXAMETHASONE SODIUM PHOSPHATE 10 MG/ML IJ SOLN
INTRAMUSCULAR | Status: DC | PRN
  Administered 2023-02-09: 10 mg via INTRAVENOUS

## 2023-02-09 MED ORDER — CHLORHEXIDINE GLUCONATE 0.12 % MT SOLN
OROMUCOSAL | Status: AC
  Administered 2023-02-09: 15 mL via OROMUCOSAL
  Filled 2023-02-09: qty 15

## 2023-02-09 MED ORDER — SUGAMMADEX SODIUM 500 MG/5ML IV SOLN
INTRAVENOUS | Status: DC | PRN
  Administered 2023-02-09: 400 mg via INTRAVENOUS

## 2023-02-09 MED ORDER — BUPIVACAINE HCL (PF) 0.5 % IJ SOLN
INTRAMUSCULAR | Status: DC | PRN
  Administered 2023-02-09: 30 mL

## 2023-02-09 MED ORDER — BUPIVACAINE HCL (PF) 0.5 % IJ SOLN
INTRAMUSCULAR | Status: AC
  Filled 2023-02-09: qty 30

## 2023-02-09 MED ORDER — FENTANYL CITRATE (PF) 100 MCG/2ML IJ SOLN
INTRAMUSCULAR | Status: AC
  Filled 2023-02-09: qty 2

## 2023-02-09 MED ORDER — PIPERACILLIN-TAZOBACTAM 3.375 G IVPB
3.3750 g | Freq: Once | INTRAVENOUS | Status: AC
  Administered 2023-02-09: 3.375 g via INTRAVENOUS
  Filled 2023-02-09 (×2): qty 50

## 2023-02-09 MED ORDER — INDOCYANINE GREEN 25 MG IV SOLR
2.5000 mg | Freq: Once | INTRAVENOUS | Status: AC
  Filled 2023-02-09: qty 10

## 2023-02-09 MED ORDER — SCOPOLAMINE 1 MG/3DAYS TD PT72
1.0000 | MEDICATED_PATCH | TRANSDERMAL | Status: DC
  Administered 2023-02-09: 1.5 mg via TRANSDERMAL

## 2023-02-09 MED ORDER — FENTANYL CITRATE (PF) 100 MCG/2ML IJ SOLN
INTRAMUSCULAR | Status: DC | PRN
  Administered 2023-02-09: 50 ug via INTRAVENOUS
  Administered 2023-02-09 (×3): 100 ug via INTRAVENOUS

## 2023-02-09 SURGICAL SUPPLY — 41 items
BLADE SURG 15 STRL LF DISP TIS (BLADE) ×1 IMPLANT
CAUTERY HOOK MNPLR 1.6 DVNC XI (INSTRUMENTS) ×1 IMPLANT
CHLORAPREP W/TINT 26 (MISCELLANEOUS) ×1 IMPLANT
CLIP LIGATING HEM O LOK PURPLE (MISCELLANEOUS) ×1 IMPLANT
COVER LIGHT HANDLE STERIS (MISCELLANEOUS) ×2 IMPLANT
DRAPE ARM DVNC X/XI (DISPOSABLE) ×4 IMPLANT
DRAPE COLUMN DVNC XI (DISPOSABLE) ×1 IMPLANT
DRSG TEGADERM 2-3/8X2-3/4 SM (GAUZE/BANDAGES/DRESSINGS) IMPLANT
ELECT REM PT RETURN 9FT ADLT (ELECTROSURGICAL) ×1
ELECTRODE REM PT RTRN 9FT ADLT (ELECTROSURGICAL) ×1 IMPLANT
FORCEPS BPLR R/ABLATION 8 DVNC (INSTRUMENTS) ×1 IMPLANT
FORCEPS PROGRASP DVNC XI (FORCEP) ×1 IMPLANT
GAUZE SPONGE 2X2 8PLY STRL LF (GAUZE/BANDAGES/DRESSINGS) IMPLANT
GLOVE BIO SURGEON STRL SZ 6.5 (GLOVE) ×2 IMPLANT
GLOVE BIOGEL PI IND STRL 6.5 (GLOVE) ×2 IMPLANT
GLOVE BIOGEL PI IND STRL 7.0 (GLOVE) ×2 IMPLANT
GOWN STRL REUS W/TWL LRG LVL3 (GOWN DISPOSABLE) ×4 IMPLANT
GRASPER SUT TROCAR 14GX15 (MISCELLANEOUS) IMPLANT
HEMOSTAT SNOW SURGICEL 2X4 (HEMOSTASIS) IMPLANT
IRRIGATOR SUCT 8 DISP DVNC XI (IRRIGATION / IRRIGATOR) IMPLANT
MANIFOLD NEPTUNE II (INSTRUMENTS) IMPLANT
NDL HYPO 21X1.5 SAFETY (NEEDLE) ×1 IMPLANT
NDL INSUFFLATION 14GA 120MM (NEEDLE) ×1 IMPLANT
NEEDLE HYPO 21X1.5 SAFETY (NEEDLE) ×1 IMPLANT
NEEDLE INSUFFLATION 14GA 120MM (NEEDLE) ×1 IMPLANT
OBTURATOR OPTICAL STND 8 DVNC (TROCAR) ×1
OBTURATOR OPTICALSTD 8 DVNC (TROCAR) ×1 IMPLANT
PACK LAP CHOLE LZT030E (CUSTOM PROCEDURE TRAY) ×1 IMPLANT
PAD ARMBOARD 7.5X6 YLW CONV (MISCELLANEOUS) ×1 IMPLANT
PENCIL HANDSWITCHING (ELECTRODE) ×1 IMPLANT
POSITIONER HEAD 8X9X4 ADT (SOFTGOODS) ×1 IMPLANT
SEAL UNIV 5-12 XI (MISCELLANEOUS) ×4 IMPLANT
SET BASIN LINEN APH (SET/KITS/TRAYS/PACK) ×1 IMPLANT
SET TUBE DA VINCI INSUFFLATOR (TUBING) IMPLANT
STAPLER VISISTAT (STAPLE) IMPLANT
SUT MNCRL AB 4-0 PS2 18 (SUTURE) ×2 IMPLANT
SUT VICRYL 0 AB UR-6 (SUTURE) IMPLANT
SYR 30ML LL (SYRINGE) ×1 IMPLANT
SYS RETRIEVAL 5MM INZII UNIV (BASKET) ×1
SYSTEM RETRIEVL 5MM INZII UNIV (BASKET) ×1 IMPLANT
WATER STERILE IRR 500ML POUR (IV SOLUTION) ×1 IMPLANT

## 2023-02-09 NOTE — Anesthesia Postprocedure Evaluation (Signed)
Anesthesia Post Note  Patient: Andrea Todd  Procedure(s) Performed: XI ROBOTIC ASSISTED LAPAROSCOPIC CHOLECYSTECTOMY (Abdomen)  Patient location during evaluation: PACU Anesthesia Type: General Level of consciousness: awake and alert Pain management: pain level controlled Vital Signs Assessment: post-procedure vital signs reviewed and stable Respiratory status: spontaneous breathing, nonlabored ventilation, respiratory function stable and patient connected to nasal cannula oxygen Cardiovascular status: blood pressure returned to baseline and stable Postop Assessment: no apparent nausea or vomiting Anesthetic complications: no   There were no known notable events for this encounter.   Last Vitals:  Vitals:   02/09/23 1257 02/09/23 1300  BP:  126/80  Pulse: 84 93  Resp: 16 14  Temp:    SpO2: 100% 100%    Last Pain:  Vitals:   02/09/23 1257  TempSrc:   PainSc: 9                  Leopoldo Mazzie L Skyla Champagne

## 2023-02-09 NOTE — Progress Notes (Signed)
* Day of Surgery *  Subjective: Andrea Todd is a 23 year old female w/ PMHx of back pain who is admitted for acute biliary colic and symptomatic cholelithiasis. She is scheduled for robotic laparoscopic cholecystectomy this morning. The patient says that her nausea and pain were better overnight with medications. She still has a decreased appetite and has not passed stool. She asks about dietary changes that may occur post-op.  Objective: Vital signs in last 24 hours: Temp:  [97.6 F (36.4 C)-98.7 F (37.1 C)] 98.3 F (36.8 C) (01/16 0846) Pulse Rate:  [74-98] 98 (01/16 0348) Resp:  [15-18] 18 (01/15 2320) BP: (117-142)/(75-99) 130/91 (01/16 0348) SpO2:  [97 %-100 %] 100 % (01/16 0348) Weight:  [84.2 kg] 84.2 kg (01/15 2320) Last BM Date : 02/07/23  Intake/Output from previous day: 01/15 0701 - 01/16 0700 In: 190.6 [P.O.:160; IV Piggyback:30.6] Out: -  Intake/Output this shift: No intake/output data recorded.  General appearance: alert, cooperative, appears stated age, and no distress Respiratory: No respiratory distress. Normal effort, able to speak in full sentences.  Lab Results:  Recent Labs    02/08/23 0359  WBC 11.5*  HGB 13.9  HCT 41.5  PLT 433*   BMET Recent Labs    02/08/23 0359 02/09/23 0408  NA 137 137  K 3.7 3.6  CL 102 99  CO2 26 29  GLUCOSE 119* 100*  BUN 10 6  CREATININE 0.64 0.60  CALCIUM 9.7 9.2   PT/INR No results for input(s): "LABPROT", "INR" in the last 72 hours.  Studies/Results: US Abdomen Limited RUQ (LIVER/GB) Result Date: 02/08/2023 CLINICAL DATA:  Pain EXAM: ULTRASOUND ABDOMEN LIMITED RIGHT UPPER QUADRANT COMPARISON:  CT earlier 02/08/2023 FINDINGS: Gallbladder: Distended gallbladder. Multiple shadowing stones. No wall thickening or adjacent fluid. Common bile duct: Diameter: 4 mm Liver: No focal lesion identified. Within normal limits in parenchymal echogenicity. Portal vein is patent on color Doppler imaging with normal direction  of blood flow towards the liver. Other: None. IMPRESSION: Distended gallbladder with multiple stones. No wall thickening or adjacent fluid or ductal dilatation. No further sonographic evidence of acute cholecystitis at this time. Further evaluation as clinically appropriate Electronically Signed   By: Karen Kays M.D.   On: 02/08/2023 10:04   CT ABDOMEN PELVIS W CONTRAST Result Date: 02/08/2023 CLINICAL DATA:  Acute, nonlocalized abdominal pain. Right flank pain now with nausea and vomiting. EXAM: CT ABDOMEN AND PELVIS WITH CONTRAST TECHNIQUE: Multidetector CT imaging of the abdomen and pelvis was performed using the standard protocol following bolus administration of intravenous contrast. RADIATION DOSE REDUCTION: This exam was performed according to the departmental dose-optimization program which includes automated exposure control, adjustment of the mA and/or kV according to patient size and/or use of iterative reconstruction technique. CONTRAST:  OMNIPAQUE IOHEXOL 300 MG/ML  SOLN COMPARISON:  None Available. FINDINGS: Lower chest:  No contributory findings. Hepatobiliary: No focal liver abnormality.Cholelithiasis with gas containing gallstones seen in the moderately full gallbladder. No convincing gallbladder wall thickening or pericholecystic edema. There is mild intrahepatic biliary duct prominence for age. No CBD dilatation or calcified stone seen. Pancreas: Unremarkable. Spleen: Unremarkable. Adrenals/Urinary Tract: Negative adrenals. No hydronephrosis or stone. Unremarkable bladder. Stomach/Bowel:  No obstruction. No appendicitis. Vascular/Lymphatic: No acute vascular abnormality. No mass or adenopathy. Reproductive:No pathologic findings. Other: No ascites or pneumoperitoneum. Musculoskeletal: No acute abnormalities. IMPRESSION: Cholelithiasis with mild intrahepatic bile duct dilatation, correlate with right upper quadrant exam and liver labs. Electronically Signed   By: Audry Riles.D.  On: 02/08/2023 05:38    Anti-infectives: Anti-infectives (From admission, onward)    None       Assessment/Plan: Andrea Todd is a 23 year old female w/ PMHx of chronic back pain who presents to the ED for RUQ abdominal pain, nausea, vomiting since 1 am who was found to have cholelithiasis on CT and [place].  Biliary Colic  Symptomatic Cholelithiasis Cholelithiasis on CT, subsequent ultrasound consistent; no wall thickening or surrounding inflammation. Her pain is significant and typical for biliary colic and she is scheduled for  lap cholecystectomy today. 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 She has not been followed since. Discussed her old labs with hematology who recommended DDAVP prior to surgery. - DDAVP prior to surgery   Chronic Back Pain Her back pain is mid-lower and bilateral and feels different than current pain. She is followed for this and has gone to/goes to physical therapy. I do not suspect that this pain is related to her current constellation of symptoms. - Flexeril 10 mg TID PRN   LOS: 0 days    Kayleen Memos 02/09/2023

## 2023-02-09 NOTE — Anesthesia Procedure Notes (Signed)
Procedure Name: Intubation Date/Time: 02/09/2023 10:09 AM  Performed by: Shanon Payor, CRNAPre-anesthesia Checklist: Patient identified, Emergency Drugs available, Suction available, Patient being monitored and Timeout performed Patient Re-evaluated:Patient Re-evaluated prior to induction Oxygen Delivery Method: Circle system utilized Preoxygenation: Pre-oxygenation with 100% oxygen Induction Type: IV induction Ventilation: Mask ventilation without difficulty Laryngoscope Size: Mac and 3 Grade View: Grade I Tube type: Oral Tube size: 7.0 mm Number of attempts: 1 Airway Equipment and Method: Stylet Placement Confirmation: ETT inserted through vocal cords under direct vision, positive ETCO2, CO2 detector and breath sounds checked- equal and bilateral Secured at: 22 cm Tube secured with: Tape Dental Injury: Teeth and Oropharynx as per pre-operative assessment

## 2023-02-09 NOTE — Progress Notes (Addendum)
[facility]  Updated family. Stay overnight. Acute suppurative cholecystitis. Will do antibiotics. Will remove staples in 2 weeks.  Letter for work written. If she needs to extend it that is fine. Family aware she will be seen by Dr. Lovell Sheehan tomorrow. Hopefully home tomorrow. Rx will be sent in.   Algis Greenhouse, MD [facility] [address] [place], [place] [address] [phone] (office)

## 2023-02-09 NOTE — Transfer of Care (Signed)
Immediate Anesthesia Transfer of Care Note  Patient: Andrea Todd  Procedure(s) Performed: XI ROBOTIC ASSISTED LAPAROSCOPIC CHOLECYSTECTOMY (Abdomen)  Patient Location: PACU  Anesthesia Type:General  Level of Consciousness: awake, drowsy, and patient cooperative  Airway & Oxygen Therapy: Patient connected to face mask oxygen  Post-op Assessment: Report given to RN, Post -op Vital signs reviewed and stable, and Patient moving all extremities X 4  Post vital signs: Reviewed and stable  Last Vitals:  Vitals Value Taken Time  BP 113/69 02/09/23 1203  Temp 36.9 C 02/09/23 1203  Pulse 90 02/09/23 1207  Resp 17 02/09/23 1207  SpO2 100 % 02/09/23 1207  Vitals shown include unfiled device data.  Last Pain:  Vitals:   02/09/23 1203  TempSrc:   PainSc: Asleep         Complications: No notable events documented.

## 2023-02-09 NOTE — Op Note (Signed)
[facility] Operative Note  02/09/23  Preoperative Diagnosis: Symptomatic Cholelithiasis, intractable nausea/ vomiting and pain    Postoperative Diagnosis: Acute suppurative cholecystitis    Procedure(s) Performed: Robotic Assisted Laparoscopic Cholecystectomy   Surgeon: Lillia Abed C. Henreitta Leber, MD   Assistants: No qualified resident was available    Anesthesia: General endotracheal   Anesthesiologist: Ronelle Nigh, MD    Specimens: Gallbladder   Estimated Blood Loss: Minimal   Blood Replacement: None    Complications: None   Wound Class: Dirty infected    Operative Indications: The patient was found to have stones on imaging and was symptomatic but no obvious signs of cholecystitis. She continued to have intractable pain and nausea/vomiting. Given this the ED reached out.  We discussed the risk of the procedure including but not limited to bleeding, infection, injury to the common bile duct, bile leak, need for further procedures, chance of subtotal cholecystectomy. She had a distant history of a low but nondiagnostic VWB factor workup and history of heavy periods. I talked to Hematology, and gave her DDAVP preoperatively, and will have her follow up with the post operative.   Findings:  Purulent bile from gallbladder Critical view of safety noted All clips intact at the end of the case Adequate hemostasis   Procedure: Firefly was given in the preoperative area. The patient was taken to the operating room and placed supine. General endotracheal anesthesia was induced. Intravenous antibiotics were  administered per protocol.  An orogastric tube positioned to decompress the stomach. The abdomen was prepared and draped in the usual sterile fashion.   Veress needle was placed at the Palmer's point and could not get access.  I then went to the infraumbilical region with the Veress (due to a belly button ring hole) insufflation was started after confirming a positive  saline drop test and no immediate increase in abdominal pressure.  After reaching 15 mm, the Veress needle was removed and a 8 mm port was placed via optiview technique infraumbilical, measuring 20 mm away from the suspected position of the gallbladder.  The abdomen was inspected and no abnormalities or injuries were found.  The palmer's point did not violate the peritoneal layer. Under direct vision, ports were placed in the following locations in a semi curvilinear position around the target of the gallbladder: Two 8 mm ports on the patient's right each having 8cm clearance to the adjacent ports and one 8 mm port placed on the patient's left 8 cm from the umbilical port. Once ports were placed, the table was placed in the reverse Trendelenburg position with the right side up. The Xi platform was brought into the operative field and docked to the ports successfully.  An endoscope was placed through the umbilical port, prograsper through the most lateral right port, forced bipolar to the port just right of the umbilicus, and then a hook cautery in the left port.  There was significant inflammation and rind on the gallbladder.  The dome of the gallbladder was grasped with prograsp and retracted over the dome of the liver. Adhesions between the gallbladder and omentum, duodenum and transverse colon were lysed via hook cautery. The infundibulum was grasped with the forced bipolar and retracted toward the right lower quadrant. This maneuver exposed Calot's triangle. Firefly was used throughout the dissection to ensure safe visualization of the cystic duct.The peritoneum overlying the gallbladder infundibulum was then dissected and the cystic duct and cystic artery identified.  Critical view of safety with the liver bed clearly visible  behind the duct and artery with no additional structures noted.  The cystic duct and cystic artery were doubly clipped and divided close to the gallbladder.    The gallbladder was  then dissected from its peritoneal and liver bed attachments by electrocautery. There was some spillage of purulent appearing bile. Hemostasis was checked prior to removing the hook cautery.  Jamelle Haring was placed in the gallbladder fossa. A 5mm Endo Catch bag was then placed through the left side port. The [employer] was undocked and moved out of the field,  and the gallbladder was removed in the bag.  The gallbladder was passed off the table as a specimen. There was no evidence of bleeding from the gallbladder fossa or cystic artery or leakage of the bile from the cystic duct stump. The left port site closed with a 0 vicryl and PMI due to dilation from removing the gallbladder.The abdomen was desufflated and secondary trocars were removed under direct vision.   No bleeding was noted. All skin incisions were closed with staples. The Palmer point veress site was closed with dermabond.    Final inspection revealed acceptable hemostasis. All counts were correct at the end of the case. The patient was awakened from anesthesia and extubated without complication. The OG tube was removed.  The patient went to the PACU in stable condition.   Algis Greenhouse, MD [facility] [address] [place], [place] [address] [phone] (office)

## 2023-02-09 NOTE — Anesthesia Preprocedure Evaluation (Addendum)
Anesthesia Evaluation  Patient identified by MRN, date of birth, ID band Patient awake    Reviewed: Allergy & Precautions, H&P , NPO status , Patient's Chart, lab work & pertinent test results, reviewed documented beta blocker date and time   History of Anesthesia Complications (+) PONV and history of anesthetic complications  Airway Mallampati: II  TM Distance: >3 FB Neck ROM: full    Dental no notable dental hx. (+) Teeth Intact, Dental Advisory Given   Pulmonary Current Smoker   Pulmonary exam normal breath sounds clear to auscultation       Cardiovascular Exercise Tolerance: Good negative cardio ROS Normal cardiovascular exam Rhythm:regular Rate:Normal     Neuro/Psych  PSYCHIATRIC DISORDERS  Depression    negative neurological ROS     GI/Hepatic negative GI ROS, Neg liver ROS,,,  Endo/Other  negative endocrine ROS    Renal/GU negative Renal ROS  negative genitourinary   Musculoskeletal   Abdominal Normal abdominal exam  (+)   Peds  Hematology negative hematology ROS (+)   Anesthesia Other Findings   Reproductive/Obstetrics negative OB ROS                              Anesthesia Physical Anesthesia Plan  ASA: 1  Anesthesia Plan: General   Post-op Pain Management: Dilaudid IV   Induction: Intravenous  PONV Risk Score and Plan: 4 or greater and Ondansetron, Dexamethasone, Midazolam and Scopolamine patch - Pre-op  Airway Management Planned: Oral ETT  Additional Equipment: None  Intra-op Plan:   Post-operative Plan: Extubation in OR  Informed Consent: I have reviewed the patients History and Physical, chart, labs and discussed the procedure including the risks, benefits and alternatives for the proposed anesthesia with the patient or authorized representative who has indicated his/her understanding and acceptance.     Dental Advisory Given  Plan Discussed with:  CRNA  Anesthesia Plan Comments:         Anesthesia Quick Evaluation

## 2023-02-09 NOTE — Plan of Care (Signed)

## 2023-02-09 NOTE — Progress Notes (Signed)
Pharmacy Antibiotic Note  Kerianna DAWNEL BIVEN is a 23 y.o. female admitted on 02/08/2023 with purulent cholecystitis.  Pharmacy has been consulted for Zosyn dosing.  Plan: Zosyn 3.375g IV q8h (4 hour infusion).  Height: 5\' 5"  (165.1 cm) Weight: 84.2 kg (185 lb 10 oz) IBW/kg (Calculated) : 57  Temp (24hrs), Avg:98.2 F (36.8 C), Min:97.6 F (36.4 C), Max:98.7 F (37.1 C)  Recent Labs  Lab 02/08/23 0359 02/09/23 0408  WBC 11.5*  --   CREATININE 0.64 0.60    Estimated Creatinine Clearance: 117.2 mL/min (by C-G formula based on SCr of 0.6 mg/dL).    No Known Allergies  Antimicrobials this admission: Zosyn 1/16 >>   Microbiology results: None pending  Thank you for allowing pharmacy to be a part of this patient's care.  Tad Moore 02/09/2023 1:23 PM

## 2023-02-09 NOTE — Interval H&P Note (Signed)
History and Physical Interval Note:  02/09/2023 8:53 AM  Minyon Lucia Gaskins  has presented today for surgery, with the diagnosis of biliary colic, intractable pain, nausea/vomiting.  The various methods of treatment have been discussed with the patient and family. After consideration of risks, benefits and other options for treatment, the patient has consented to  Procedure(s): XI ROBOTIC ASSISTED LAPAROSCOPIC CHOLECYSTECTOMY (N/A) as a surgical intervention.  The patient's history has been reviewed, patient examined, no change in status, stable for surgery.  I have reviewed the patient's chart and labs.  Questions were answered to the patient's satisfaction.    Liver test still within normal limits but bili is up for patient's normal. Discussed that this is not enough to order MRCP right now but will keep that in mind if issues or continued pain. Discussed DDAVP and reasoning for this. Will get DDAVP in preop.   Lucretia Roers

## 2023-02-09 NOTE — Progress Notes (Addendum)
Called Darl Pikes, patients grandma, and gave her a patient update.  Informed her patient is stable, awake and eating a snack and she will probably return to her room within the next 30 minutes after we give her a pain pill. Patients grandma voiced understanding.

## 2023-02-10 ENCOUNTER — Other Ambulatory Visit: Payer: Self-pay

## 2023-02-10 ENCOUNTER — Telehealth: Payer: Self-pay | Admitting: Family Medicine

## 2023-02-10 DIAGNOSIS — K8020 Calculus of gallbladder without cholecystitis without obstruction: Secondary | ICD-10-CM | POA: Diagnosis present

## 2023-02-10 DIAGNOSIS — K8000 Calculus of gallbladder with acute cholecystitis without obstruction: Secondary | ICD-10-CM | POA: Diagnosis present

## 2023-02-10 DIAGNOSIS — Z79899 Other long term (current) drug therapy: Secondary | ICD-10-CM | POA: Diagnosis not present

## 2023-02-10 DIAGNOSIS — Z8249 Family history of ischemic heart disease and other diseases of the circulatory system: Secondary | ICD-10-CM | POA: Diagnosis not present

## 2023-02-10 DIAGNOSIS — G8929 Other chronic pain: Secondary | ICD-10-CM | POA: Diagnosis present

## 2023-02-10 DIAGNOSIS — M549 Dorsalgia, unspecified: Secondary | ICD-10-CM | POA: Diagnosis present

## 2023-02-10 DIAGNOSIS — F17210 Nicotine dependence, cigarettes, uncomplicated: Secondary | ICD-10-CM | POA: Diagnosis present

## 2023-02-10 DIAGNOSIS — Z8659 Personal history of other mental and behavioral disorders: Secondary | ICD-10-CM

## 2023-02-10 LAB — COMPREHENSIVE METABOLIC PANEL
ALT: 47 U/L — ABNORMAL HIGH (ref 0–44)
AST: 37 U/L (ref 15–41)
Albumin: 3.5 g/dL (ref 3.5–5.0)
Alkaline Phosphatase: 75 U/L (ref 38–126)
Anion gap: 9 (ref 5–15)
BUN: 8 mg/dL (ref 6–20)
CO2: 25 mmol/L (ref 22–32)
Calcium: 8.8 mg/dL — ABNORMAL LOW (ref 8.9–10.3)
Chloride: 100 mmol/L (ref 98–111)
Creatinine, Ser: 0.61 mg/dL (ref 0.44–1.00)
GFR, Estimated: >60 mL/min (ref >60)
Glucose, Bld: 97 mg/dL (ref 70–99)
Potassium: 4 mmol/L (ref 3.5–5.1)
Sodium: 134 mmol/L — ABNORMAL LOW (ref 135–145)
Total Bilirubin: 0.7 mg/dL (ref 0.0–1.2)
Total Protein: 6.6 g/dL (ref 6.5–8.1)

## 2023-02-10 LAB — CBC WITH DIFFERENTIAL/PLATELET
Abs Immature Granulocytes: 0.15 10*3/uL — ABNORMAL HIGH (ref 0.00–0.07)
Basophils Absolute: 0 10*3/uL (ref 0.0–0.1)
Basophils Relative: 0 %
Eosinophils Absolute: 0 10*3/uL (ref 0.0–0.5)
Eosinophils Relative: 0 %
HCT: 36.7 % (ref 36.0–46.0)
Hemoglobin: 12.2 g/dL (ref 12.0–15.0)
Immature Granulocytes: 1 %
Lymphocytes Relative: 9 %
Lymphs Abs: 2.1 10*3/uL (ref 0.7–4.0)
MCH: 31.8 pg (ref 26.0–34.0)
MCHC: 33.2 g/dL (ref 30.0–36.0)
MCV: 95.6 fL (ref 80.0–100.0)
Monocytes Absolute: 2.6 10*3/uL — ABNORMAL HIGH (ref 0.1–1.0)
Monocytes Relative: 11 %
Neutro Abs: 17.9 10*3/uL — ABNORMAL HIGH (ref 1.7–7.7)
Neutrophils Relative %: 79 %
Platelets: 381 10*3/uL (ref 150–400)
RBC: 3.84 MIL/uL — ABNORMAL LOW (ref 3.87–5.11)
RDW: 12 % (ref 11.5–15.5)
WBC: 22.7 10*3/uL — ABNORMAL HIGH (ref 4.0–10.5)
nRBC: 0 % (ref 0.0–0.2)

## 2023-02-10 LAB — SURGICAL PATHOLOGY

## 2023-02-10 NOTE — Telephone Encounter (Signed)
[facility] please

## 2023-02-10 NOTE — Plan of Care (Signed)
  Problem: Education: Goal: Knowledge of General Education information will improve Description Including pain rating scale, medication(s)/side effects and non-pharmacologic comfort measures Outcome: Progressing   Problem: Health Behavior/Discharge Planning: Goal: Ability to manage health-related needs will improve Outcome: Progressing   

## 2023-02-10 NOTE — Progress Notes (Addendum)
1 Day Post-Op  Subjective: Andrea Todd is a 23 year old female w/ PMHx of back pain who is Day 1 s/p cholecystectomy for acute biliary colic and symptomatic cholelithiasis. She ate potatoes and mac n cheese last night without discomfort. She has not passed flatus or stool, but had some belching. The patient denies nausea, vomiting, subjective fever. Andrea Todd has some diffuse abdominal pain, primarily around the port sites. She notes that yesterday when walking around she had a little bit of bleeding from infraumbilical port site that has since stopped. The patient feels better now than prior to her surgery. 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  Objective: Vital signs in last 24 hours: Temp:  [98.2 F (36.8 C)-98.6 F (37 C)] 98.3 F (36.8 C) (01/17 0518) Pulse Rate:  [80-104] 96 (01/17 0518) Resp:  [12-21] 18 (01/16 1514) BP: (113-141)/(69-97) 115/71 (01/17 0518) SpO2:  [94 %-100 %] 98 % (01/16 2300) Weight:  [84.2 kg] 84.2 kg (01/16 0846) Last BM Date : 02/07/23  Intake/Output from previous day: 01/16 0701 - 01/17 0700 In: 1271.4 [P.O.:240; I.V.:838.2; IV Piggyback:193.2] Out: 20 [Blood:20] Intake/Output this shift: No intake/output data recorded.  Physical Exam Constitutional:      General: She is not in acute distress.    Appearance: Normal appearance. She is not ill-appearing.  HENT:     Head: Normocephalic.     Ears:     Comments: Hearing grossly normal Eyes:     Comments: Eye movements grossly normal  Cardiovascular:     Rate and Rhythm: Normal rate and regular rhythm.     Heart sounds: Normal heart sounds.  Pulmonary:     Effort: Pulmonary effort is normal.  Abdominal:     General: Abdomen is flat. There is no distension.     Palpations: There is no mass.     Tenderness: There is abdominal tenderness (mild, limited to lateral port sites). There is no guarding.   Musculoskeletal:     Cervical back: Normal range of motion.     Comments: Moves all four extremities spontaneously  Skin:    Comments: Dried blood in the umbilicus which drained from infraumbilical surgical port site, which has 3 staples. The left side of this surgical site has 1mm of superficial skin separation. No active bleeding, erythema, or drainage from any port sites, which were covered with gauze dressing.  Neurological:     Mental Status: She is alert and oriented to person, place, and time. Mental status is at baseline.     Motor: No weakness.  Psychiatric:        Mood and Affect: Mood normal.        Behavior: Behavior normal.     Lab Results:  Recent Labs    02/08/23 0359 02/10/23 0458  WBC 11.5* 22.7*  HGB 13.9 12.2  HCT 41.5 36.7  PLT 433* 381   BMET Recent Labs    02/09/23 0408 02/10/23 0458  NA 137 134*  K 3.6 4.0  CL 99 100  CO2 29 25  GLUCOSE 100* 97  BUN 6 8  CREATININE 0.60 0.61  CALCIUM 9.2 8.8*   PT/INR No results for input(s): "LABPROT", "INR" in the last 72 hours.  Studies/Results: US Abdomen Limited RUQ (LIVER/GB) Result Date: 02/08/2023 CLINICAL DATA:  Pain EXAM: ULTRASOUND ABDOMEN LIMITED RIGHT UPPER QUADRANT COMPARISON:  CT earlier 02/08/2023 FINDINGS: Gallbladder: Distended  gallbladder. Multiple shadowing stones. No wall thickening or adjacent fluid. Common bile duct: Diameter: 4 mm Liver: No focal lesion identified. Within normal limits in parenchymal echogenicity. Portal vein is patent on color Doppler imaging with normal direction of blood flow towards the liver. Other: None. IMPRESSION: Distended gallbladder with multiple stones. No wall thickening or adjacent fluid or ductal dilatation. No further sonographic evidence of acute cholecystitis at this time. Further evaluation as clinically appropriate Electronically Signed   By: Karen Kays M.D.   On: 02/08/2023 10:04    Anti-infectives: Anti-infectives (From admission, onward)     Start     Dose/Rate Route Frequency Ordered Stop   02/09/23 2200  piperacillin-tazobactam (ZOSYN) IVPB 3.375 g        3.375 g 12.5 mL/hr over 240 Minutes Intravenous Every 8 hours 02/09/23 1323     02/09/23 1330  piperacillin-tazobactam (ZOSYN) IVPB 3.375 g        3.375 g 100 mL/hr over 30 Minutes Intravenous  Once 02/09/23 1227 02/09/23 1556   02/09/23 0950  cefoTEtan (CEFOTAN) 2 g in sodium chloride 0.9 % 100 mL IVPB        2 g 200 mL/hr over 30 Minutes Intravenous 60 min pre-op 02/09/23 4401 02/09/23 1638   02/09/23 0940  sodium chloride 0.9 % with cefoTEtan (CEFOTAN) ADS Med       Note to Pharmacy: Waynard Edwards S: cabinet override      02/09/23 0940 02/09/23 1026   02/09/23 0000  amoxicillin-clavulanate (AUGMENTIN) 875-125 MG tablet        1 tablet Oral 2 times daily 02/09/23 1421 02/14/23 2359       Assessment/Plan: Andrea Todd is a 23 year old female w/ PMHx of chronic back pain who presents to the ED for RUQ abdominal pain, nausea, vomiting since 1 am who was found to have cholelithiasis on CT and [place].   S/p Cholecystectomy  Acute Cholecystitis Day 1 s/p cholecystectomy. WBC 22.7, up from 11.5 yesterday. Afebrile, feels well. No flatus or stool, some belching. Surgical sites appear well with no dehiscence warranting intervention or repair. Due to risk of developing infection, plan to keep her here another day.  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 She has not been followed since. Gave DDAVP prior to surgery at recommendation of hematologist.   Chronic Back Pain Her back pain is mid-lower and bilateral and feels different than current pain. She is followed for this and has gone to/goes to physical therapy. I do not suspect that this pain is related to her current constellation of symptoms. - Flexeril  10 mg TID PRN   LOS: 0 days    Kayleen Memos 02/10/2023

## 2023-02-10 NOTE — Progress Notes (Signed)
Patient has small blister like area to right lower abdomen , notified Dr. Lovell Sheehan states he will look at it in the morning. Patient denies itching or hurting to area, port sites look intact

## 2023-02-10 NOTE — Telephone Encounter (Signed)
Order sent.

## 2023-02-10 NOTE — Telephone Encounter (Signed)
Copied from CRM [id number]. Topic: Referral - Question >> Feb 10, 2023 10:34 AM Shelah Lewandowsky wrote: Reason for CRM: April with [facility], states they are not able accept her due to current use of oxyCODONE (OXY IR/ROXICODONE), please call with any questions [phone] [phone]

## 2023-02-11 LAB — CBC
HCT: 40.2 % (ref 36.0–46.0)
Hemoglobin: 13.1 g/dL (ref 12.0–15.0)
MCH: 31.2 pg (ref 26.0–34.0)
MCHC: 32.6 g/dL (ref 30.0–36.0)
MCV: 95.7 fL (ref 80.0–100.0)
Platelets: 372 10*3/uL (ref 150–400)
RBC: 4.2 MIL/uL (ref 3.87–5.11)
RDW: 12 % (ref 11.5–15.5)
WBC: 14.8 10*3/uL — ABNORMAL HIGH (ref 4.0–10.5)
nRBC: 0 % (ref 0.0–0.2)

## 2023-02-11 NOTE — Plan of Care (Signed)
  Problem: Education: Goal: Knowledge of General Education information will improve Description: Including pain rating scale, medication(s)/side effects and non-pharmacologic comfort measures Outcome: Progressing   Problem: Clinical Measurements: Goal: Ability to maintain clinical measurements within normal limits will improve Outcome: Progressing Goal: Will remain free from infection Outcome: Progressing Goal: Diagnostic test results will improve Outcome: Progressing   Problem: Nutrition: Goal: Adequate nutrition will be maintained Outcome: Progressing   Problem: Coping: Goal: Level of anxiety will decrease Outcome: Progressing   Problem: Elimination: Goal: Will not experience complications related to bowel motility Outcome: Progressing   Problem: Pain Managment: Goal: General experience of comfort will improve and/or be controlled Outcome: Progressing

## 2023-02-11 NOTE — Discharge Summary (Signed)
Physician Discharge Summary  Patient ID: ROMOLA WORKS MRN: [id number] DOB/AGE: 18-Dec-2000 23 y.o.  Admit date: 02/08/2023 Discharge date: 02/11/2023  Admission Diagnoses: Acute cholecystitis  Discharge Diagnoses:  Principal Problem:   Acute suppurative cholecystitis Active Problems:   Intractable nausea and vomiting   Intractable right upper quadrant abdominal pain   Cholelithiasis   Discharged Condition: good  Hospital Course: Patient is a 23 year old white female who presented to the emergency room on 02/08/2023 with right upper quadrant abdominal pain.  She was diagnosed with acute cholecystitis.  She went to the operating room on 02/09/2023 and underwent a robotic assisted laparoscopic cholecystectomy.  She was found at the time to have suppurative cholecystitis.  Her postoperative course was for the most part unremarkable.  She did have an elevated white blood cell count on postoperative day 1, but this has started to normalize on postoperative day 2.  Her diet was advanced out difficulty.  She is being discharged home on 02/11/2023 in good and improving condition.  Treatments: surgery: Robotic assisted laparoscopic cholecystectomy on 02/09/2023  Discharge Exam: Blood pressure 108/68, pulse (!) 109, temperature 99.3 F (37.4 C), temperature source Oral, resp. rate 20, height 5\' 5"  (1.651 m), weight 84.2 kg, last menstrual period 01/26/2023, SpO2 96%. General appearance: alert, cooperative, and no distress Resp: clear to auscultation bilaterally Cardio: regular rate and rhythm, S1, S2 normal, no murmur, click, rub or gallop GI: Soft, incisions healing well.  Disposition: Discharge disposition: 01-Home or Self Care       Discharge Instructions     Diet - low sodium heart healthy   Complete by: As directed    Increase activity slowly   Complete by: As directed       Allergies as of 02/11/2023   No Known Allergies      Medication List     TAKE these medications     acetaminophen 325 MG tablet Commonly known as: TYLENOL Take 650 mg by mouth every 6 (six) hours as needed for moderate pain (pain score 4-6).   amoxicillin-clavulanate 875-125 MG tablet Commonly known as: AUGMENTIN Take 1 tablet by mouth 2 (two) times daily for 5 days.   cholecalciferol 25 MCG (1000 UNIT) tablet Commonly known as: VITAMIN D3 Take 1,000 Units by mouth daily.   cyclobenzaprine 10 MG tablet Commonly known as: FLEXERIL Take 1 tablet (10 mg total) by mouth at bedtime as needed.   ferrous sulfate 325 (65 FE) MG EC tablet Take 325 mg by mouth daily with breakfast.   meloxicam 7.5 MG tablet Commonly known as: MOBIC Take 1 tablet (7.5 mg total) by mouth daily as needed for pain.   multivitamin with minerals Tabs tablet Take 1 tablet by mouth daily.   ondansetron 4 MG disintegrating tablet Commonly known as: ZOFRAN-ODT Take 1 tablet (4 mg total) by mouth every 6 (six) hours as needed for nausea.   oxyCODONE 5 MG immediate release tablet Commonly known as: Oxy IR/ROXICODONE Take 1 tablet (5 mg total) by mouth every 4 (four) hours as needed for severe pain (pain score 7-10) or breakthrough pain.   vitamin B-12 100 MCG tablet Commonly known as: CYANOCOBALAMIN Take 100 mcg by mouth daily.        Follow-up Information     Lucretia Roers, MD. Schedule an appointment as soon as possible for a visit on 02/23/2023.   Specialty: General Surgery Why: staple removal Contact information: [address] [place] [address] [phone]  Doreatha Massed, MD Follow up.   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 Contact information: [address] [place] [address] [phone]                 Signed: Franky Macho 02/11/2023, 10:12 AM

## 2023-02-13 ENCOUNTER — Encounter: Payer: Self-pay | Admitting: Family Medicine

## 2023-02-17 ENCOUNTER — Telehealth: Payer: Self-pay | Admitting: Family Medicine

## 2023-02-17 NOTE — Telephone Encounter (Signed)
FMLA paperwork completed and emailed to [email or web address]. per patient request.  STD paperwork completed and faxed to [facility] at [phone]. Confirmation received.   Out of work starting 02/08/2023 and may return to work unrestricted on 02/27/2023.

## 2023-02-23 ENCOUNTER — Encounter: Payer: Self-pay | Admitting: General Surgery

## 2023-02-23 ENCOUNTER — Ambulatory Visit: Payer: Managed Care, Other (non HMO) | Admitting: General Surgery

## 2023-02-23 VITALS — BP 116/79 | HR 112 | Temp 98.3°F | Resp 16 | Ht 65.0 in | Wt 181.0 lb

## 2023-02-23 DIAGNOSIS — K810 Acute cholecystitis: Secondary | ICD-10-CM

## 2023-02-23 NOTE — Progress Notes (Signed)
[facility] Note   HPI:  23 y.o. Female presents to clinic for post-op follow-up evaluation after robotic cholecystectomy. Patient reports she is doing well and feeling much better, denies any major issues. She is having some left sided soreness.  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 No bleeding issues with surgery af ter DDAVP given preventatively after discussion with Dr. Ellin Saba.   Review of Systems:  No fever or chills Soreness  All other review of systems: otherwise negative   Vital Signs:  BP 116/79   Pulse (!) 112   Temp 98.3 F (36.8 C) (Oral)   Resp 16   Ht 5\' 5"  (1.651 m)   Wt 181 lb (82.1 kg)   LMP 01/26/2023 (Approximate)   SpO2 99%   BMI 30.12 kg/m    Physical Exam:  Physical Exam Vitals reviewed.  Cardiovascular:     Rate and Rhythm: Normal rate.  Pulmonary:     Effort: Pulmonary effort is normal.  Abdominal:     General: There is no distension.     Palpations: Abdomen is soft.     Tenderness: There is no abdominal tenderness.     Comments: No erythema or drainage, staples removed, steristrips placed  Musculoskeletal:        General: Normal range of motion.  Skin:    General: Skin is warm.  Neurological:     Mental Status: She is alert.      Assessment:  23 y.o. yo Female s/p robotic cholecystectomy for acute suppurative cholecystectomy. Doing well. Labs all were improving at discharge.   Plan:  Diet and activity as tolerated. Steristrips will peel off in the next 5-7 days. You can remove them once they are peeling off. It is ok to shower. Pat the area dry.  Follow up with Hematology.   Future Appointments  Date Time Provider Department Center  02/27/2023 11:00 AM Cindie Crumbly, MD [facility] None  02/27/2023 11:45 AM [facility] None  03/27/2023 10:00 AM Rankin, Rada Hay, NP [facility] None     Algis Greenhouse, MD [facility] [address] [place], [place] [address] [phone] (office)

## 2023-02-23 NOTE — Patient Instructions (Addendum)
Diet and activity as tolerated. Steristrips will peel off in the next 5-7 days. You can remove them once they are peeling off. It is ok to shower. Pat the area dry.  Follow up with Hematology.

## 2023-02-27 ENCOUNTER — Inpatient Hospital Stay: Payer: Managed Care, Other (non HMO) | Attending: Oncology

## 2023-02-27 ENCOUNTER — Inpatient Hospital Stay: Payer: Managed Care, Other (non HMO) | Admitting: Oncology

## 2023-02-27 VITALS — BP 116/77 | HR 101 | Temp 96.8°F | Resp 18 | Ht 65.35 in | Wt 184.0 lb

## 2023-02-27 DIAGNOSIS — D6800 Von Willebrand disease, unspecified: Secondary | ICD-10-CM | POA: Diagnosis not present

## 2023-02-27 DIAGNOSIS — D75839 Thrombocytosis, unspecified: Secondary | ICD-10-CM | POA: Insufficient documentation

## 2023-02-27 LAB — CBC WITH DIFFERENTIAL/PLATELET
Abs Immature Granulocytes: 0.01 10*3/uL (ref 0.00–0.07)
Basophils Absolute: 0.1 10*3/uL (ref 0.0–0.1)
Basophils Relative: 1 %
Eosinophils Absolute: 0.2 10*3/uL (ref 0.0–0.5)
Eosinophils Relative: 3 %
HCT: 37.8 % (ref 36.0–46.0)
Hemoglobin: 12.8 g/dL (ref 12.0–15.0)
Immature Granulocytes: 0 %
Lymphocytes Relative: 31 %
Lymphs Abs: 1.9 10*3/uL (ref 0.7–4.0)
MCH: 31.6 pg (ref 26.0–34.0)
MCHC: 33.9 g/dL (ref 30.0–36.0)
MCV: 93.3 fL (ref 80.0–100.0)
Monocytes Absolute: 0.5 10*3/uL (ref 0.1–1.0)
Monocytes Relative: 8 %
Neutro Abs: 3.7 10*3/uL (ref 1.7–7.7)
Neutrophils Relative %: 57 %
Platelets: 488 10*3/uL — ABNORMAL HIGH (ref 150–400)
RBC: 4.05 MIL/uL (ref 3.87–5.11)
RDW: 11.9 % (ref 11.5–15.5)
WBC: 6.3 10*3/uL (ref 4.0–10.5)
nRBC: 0 % (ref 0.0–0.2)

## 2023-02-27 LAB — APTT: aPTT: 32 s (ref 24–36)

## 2023-02-27 LAB — PROTIME-INR
INR: 1.1 (ref 0.8–1.2)
Prothrombin Time: 14.1 s (ref 11.4–15.2)

## 2023-02-27 NOTE — Assessment & Plan Note (Addendum)
Patient has a concern of von Willebrand disease based on previous workup.  History of heavy menstrual bleeding in adolescence, now improved. Previous testing in 2014 [facility]) showed von Willebrand factor levels of 35. Currently, menstrual bleeding is manageable with occasional clotting. No other significant bleeding symptoms reported. -Repeat von Willebrand factor levels to confirm or rule out diagnosis. -Discussed potential implications for future surgeries and childbirth, and the possibility of prophylactic treatment with DDAVP if necessary.  Return to clinic in 2 weeks to discuss results

## 2023-02-27 NOTE — Progress Notes (Signed)
[facility] at [facility] HEMATOLOGY NEW VISIT  Andrea Todd, [place], [place]  REASON FOR REFERRAL: Von Willebrand disease   HISTORY OF PRESENT ILLNESS: Andrea Todd 23 y.o. female referred for possible von Willebrand disease.  Patient reported that when she first obtained menstruation around [age] years she had 1 month of heavy menstrual period.  Evaluation at that time was concerning for von Willebrand disease.  She was later put on birth control and since then did not have heavy menstrual bleeding.  She has been off of birth control for several years now and only took it for 3 to 4 years after the first episode.Since discontinuing birth control, her periods have normalized to a 4-day cycle with the heaviest flow on the second and third day. She reports significant cramping and occasional clotting, but not with every cycle. She uses 4-6 pads per day, sometimes waking at night to change, but denies soaking through clothes. She denies fatigue and feels generally well. She has no known family history of bleeding disorders. She has a 62 year old sister who does not have heavy menstruation. She denies abnormal bleeding with cuts, nosebleeds, blood spots in the mouth, and blood in stools. She has never required blood or iron transfusions.   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  I have reviewed the past medical history, past surgical history, social history and family history with the patient   ALLERGIES:  has no known allergies.  MEDICATIONS:  Current Outpatient Medications  Medication Sig Dispense Refill   acetaminophen (TYLENOL) 325 MG tablet Take 650 mg by mouth every 6 (six) hours as needed for moderate pain (pain score 4-6).     cholecalciferol (VITAMIN D3) 25 MCG (1000 UNIT) tablet Take 1,000 Units by mouth daily.     ferrous sulfate 325 (65 FE) MG EC tablet Take 325 mg by mouth daily with breakfast.     Multiple Vitamin  (MULTIVITAMIN WITH MINERALS) TABS tablet Take 1 tablet by mouth daily.     ondansetron (ZOFRAN-ODT) 4 MG disintegrating tablet Take 1 tablet (4 mg total) by mouth every 6 (six) hours as needed for nausea. 20 tablet 0   oxyCODONE (OXY IR/ROXICODONE) 5 MG immediate release tablet Take 1 tablet (5 mg total) by mouth every 4 (four) hours as needed for severe pain (pain score 7-10) or breakthrough pain. 10 tablet 0   vitamin B-12 (CYANOCOBALAMIN) 100 MCG tablet Take 100 mcg by mouth daily.     No current facility-administered medications for this visit.     REVIEW OF SYSTEMS:   Constitutional: Denies fevers, chills or night sweats Eyes: Denies blurriness of vision Ears, nose, mouth, throat, and face: Denies mucositis or sore throat Respiratory: Denies cough, dyspnea or wheezes Cardiovascular: Denies palpitation, chest discomfort or lower extremity swelling Gastrointestinal:  Denies nausea, heartburn or change in bowel habits Skin: Denies abnormal skin rashes Lymphatics: Denies new lymphadenopathy or easy bruising Neurological:Denies numbness, tingling or new weaknesses Behavioral/Psych: Mood is stable, no new changes  All other systems were reviewed with the patient and are negative.  PHYSICAL EXAMINATION:   Vitals:   02/27/23 1108  BP: 116/77  Pulse: (!) 101  Resp: 18  Temp: (!) 96.8 F (36 C)  SpO2: 99%    GENERAL:alert, no distress and comfortable SKIN: skin color, texture, turgor are normal, no rashes or significant lesions EYES: normal, Conjunctiva are pink and non-injected, sclera clear LUNGS: clear to auscultation and percussion with normal breathing  effort HEART: regular rate & rhythm and no murmurs and no lower extremity edema ABDOMEN:abdomen soft, non-tender and normal bowel sounds Musculoskeletal:no cyanosis of digits and no clubbing  NEURO: alert & oriented x 3 with fluent speech  LABORATORY DATA:  I have reviewed the data as listed  Lab Results  Component Value  Date   WBC 6.3 02/27/2023   NEUTROABS 3.7 02/27/2023   HGB 12.8 02/27/2023   HCT 37.8 02/27/2023   MCV 93.3 02/27/2023   PLT 488 (H) 02/27/2023       Chemistry      Component Value Date/Time   NA 134 (L) 02/10/2023 0458   NA 137 11/25/2022 0905   K 4.0 02/10/2023 0458   CL 100 02/10/2023 0458   CO2 25 02/10/2023 0458   BUN 8 02/10/2023 0458   BUN 15 11/25/2022 0905   CREATININE 0.61 02/10/2023 0458      Component Value Date/Time   CALCIUM 8.8 (L) 02/10/2023 0458   ALKPHOS 75 02/10/2023 0458   AST 37 02/10/2023 0458   ALT 47 (H) 02/10/2023 0458   BILITOT 0.7 02/10/2023 0458   BILITOT 0.4 11/25/2022 0905     Labs reviewed from [facility] in 09/2012  PTT: 30 Von Willebrand factor activity: 35, von Willebrand factor antigen: 53, Factor VIII activity: 40  Comments:VWF:Ag: Normal, VWF:RCo: slightly decreased   ASSESSMENT & PLAN:  Patient is a 23 year old female referred for concern of low von Willebrand disease   Von Willebrand disease (HCC) Patient has a concern of von Willebrand disease based on previous workup.  History of heavy menstrual bleeding in adolescence, now improved. Previous testing in 2014 [facility]) showed von Willebrand factor levels of 35. Currently, menstrual bleeding is manageable with occasional clotting. No other significant bleeding symptoms reported. -Repeat von Willebrand factor levels to confirm or rule out diagnosis. -Discussed potential implications for future surgeries and childbirth, and the possibility of prophylactic treatment with DDAVP if necessary.  Return to clinic in 2 weeks to discuss results   Orders Placed This Encounter  Procedures   CBC with Differential/Platelet    Standing Status:   Future    Number of Occurrences:   1    Expected Date:   02/27/2023    Expiration Date:   02/27/2024   Protime-INR    Standing Status:   Future    Number of Occurrences:   1    Expected Date:   02/27/2023    Expiration Date:   02/27/2024   APTT     Standing Status:   Future    Number of Occurrences:   1    Expected Date:   02/27/2023    Expiration Date:   02/27/2024   Von Willebrand panel    Standing Status:   Future    Number of Occurrences:   1    Expected Date:   02/27/2023    Expiration Date:   02/27/2024    The total time spent in the appointment was 45 minutes encounter with patients including review of chart and various tests results, discussions about plan of care and coordination of care plan   All questions were answered. The patient knows to call the clinic with any problems, questions or concerns. No barriers to learning was detected.   Cindie Crumbly, MD 2/3/20253:55 PM

## 2023-02-27 NOTE — Patient Instructions (Signed)
VISIT SUMMARY:  You came in today to discuss your history of heavy menstrual bleeding and to evaluate the possibility of von Willebrand's disease. We reviewed your menstrual history and current symptoms, and discussed the next steps for diagnosis and management.  YOUR PLAN:  -POSSIBLE VON WILLEBRAND'S DISEASE: Von Willebrand's disease is a bleeding disorder caused by low levels of clotting protein in the blood. We will repeat your von Willebrand factor levels to confirm or rule out this diagnosis. We also discussed the potential implications for future surgeries and childbirth, and the possibility of preventive treatment if necessary.  -MENORRHAGIA: Menorrhagia means heavy menstrual bleeding. Your heavy bleeding has improved since adolescence and is currently manageable. No treatment is required at this time, but we will continue to monitor your symptoms.  INSTRUCTIONS:  Please follow up in 2 weeks to discuss the results of your von Willebrand factor levels.

## 2023-03-01 LAB — VON WILLEBRAND PANEL
Coagulation Factor VIII: 77 % (ref 56–140)
Ristocetin Co-factor, Plasma: 54 % (ref 50–200)
Von Willebrand Antigen, Plasma: 60 % (ref 50–200)

## 2023-03-01 LAB — COAG STUDIES INTERP REPORT

## 2023-03-13 ENCOUNTER — Other Ambulatory Visit: Payer: Self-pay | Admitting: Oncology

## 2023-03-13 ENCOUNTER — Inpatient Hospital Stay (HOSPITAL_BASED_OUTPATIENT_CLINIC_OR_DEPARTMENT_OTHER): Payer: Managed Care, Other (non HMO) | Admitting: Oncology

## 2023-03-13 VITALS — BP 120/92 | HR 113 | Temp 98.3°F | Resp 16 | Wt 182.1 lb

## 2023-03-13 DIAGNOSIS — D6800 Von Willebrand disease, unspecified: Secondary | ICD-10-CM | POA: Diagnosis not present

## 2023-03-13 DIAGNOSIS — D75839 Thrombocytosis, unspecified: Secondary | ICD-10-CM | POA: Diagnosis not present

## 2023-03-13 NOTE — Progress Notes (Signed)
 [facility] at [facility] HEMATOLOGY FOLLOW-UP VISIT  Del Newman Nip, Tenna Child, FNP  REASON FOR FOLLOW-UP: Suspicion for von Willebrand disease  ASSESSMENT & PLAN:  Patient is a 23 year old female following for concerns of von Willebrand disease   Von Willebrand disease (HCC) Patient has a concern of von Willebrand disease based on previous workup.  History of heavy menstrual bleeding in adolescence, now improved. Previous testing in 2014 [facility]) showed von Willebrand factor levels of 35. Currently, menstrual bleeding is manageable with occasional clotting. No other significant bleeding symptoms reported.  Labs as below- normal showing no concerns for von Willebrand disease.  Von Willebrand disease ruled out.  No hematology needs at this time.  Recommended patient to reach out to [place] with any questions or concerns in the future.  Thrombocytosis Patient has mildly elevated platelets likely reactive.  No other abnormal lab findings. -Continue iron supplements every other day -Maintain diet high in protein and greens.  If the platelets remain high, recommended the patient to reach out to [place] for follow-up.   The total time spent in the appointment was 15 minutes encounter with patients including review of chart and various tests results, discussions about plan of care and coordination of care plan   All questions were answered. The patient knows to call the clinic with any problems, questions or concerns. No barriers to learning was detected.  Cindie Crumbly, MD 2/17/20259:29 AM   SUMMARY OF HEMATOLOGIC HISTORY: Concerns for von Willebrand disease:  Labs reviewed from [facility] in 09/2012   PTT: 30 Von Willebrand factor activity: 35, von Willebrand factor antigen: 53, Factor VIII activity: 40   Comments:VWF:Ag: Normal, VWF:RCo: slightly decreased  -Labs from 02/27/23:  VON WILLEBRAND FACTOR ASSESSMENT :  - The VWF:Ag is normal. The KGM:WNUUVOZD is  normal. The FVIII  is normal.   - These results are not consistent with a diagnosis of von  Willebrand Disease.   INTERVAL HISTORY: Kla Lucia Gaskins 23 y.o. female is following for suspicion for von Willebrand disease.  Patient has no complaints today.  She has been doing very well.  We discussed that her lab results are not concerning for von Willebrand disease.  Levels are normal.  She does have some mild elevation platelets but likely secondary to iron deficiency during menstruation.  Recommended patient to eat high-protein diet with green leafy vegetables and continue iron supplements as she is taking right now  I have reviewed the past medical history, past surgical history, social history and family history with the patient   ALLERGIES:  has no known allergies.  MEDICATIONS:  Current Outpatient Medications  Medication Sig Dispense Refill   cholecalciferol (VITAMIN D3) 25 MCG (1000 UNIT) tablet Take 1,000 Units by mouth daily.     ferrous sulfate 325 (65 FE) MG EC tablet Take 325 mg by mouth daily with breakfast.     Multiple Vitamin (MULTIVITAMIN WITH MINERALS) TABS tablet Take 1 tablet by mouth daily.     vitamin B-12 (CYANOCOBALAMIN) 100 MCG tablet Take 100 mcg by mouth daily.     No current facility-administered medications for this visit.     REVIEW OF SYSTEMS:   Constitutional: Denies fevers, chills or night sweats Eyes: Denies blurriness of vision Ears, nose, mouth, throat, and face: Denies mucositis or sore throat Respiratory: Denies cough, dyspnea or wheezes Cardiovascular: Denies palpitation, chest discomfort or lower extremity swelling Gastrointestinal:  Denies nausea, heartburn or change in bowel habits Skin: Denies abnormal skin rashes Lymphatics: Denies new  lymphadenopathy or easy bruising Neurological:Denies numbness, tingling or new weaknesses Behavioral/Psych: Mood is stable, no new changes  All other systems were reviewed with the patient and are  negative.  PHYSICAL EXAMINATION:   Vitals:   03/13/23 0757  BP: (!) 120/92  Pulse: (!) 113  Resp: 16  Temp: 98.3 F (36.8 C)  SpO2: 99%    GENERAL:alert, no distress and comfortable LUNGS: clear to auscultation and percussion with normal breathing effort HEART: regular rate & rhythm and no murmurs and no lower extremity edema ABDOMEN:abdomen soft, non-tender and normal bowel sounds Musculoskeletal:no cyanosis of digits and no clubbing  NEURO: alert & oriented x 3 with fluent speech  LABORATORY DATA:  I have reviewed the data as listed  Lab Results  Component Value Date   WBC 6.3 02/27/2023   NEUTROABS 3.7 02/27/2023   HGB 12.8 02/27/2023   HCT 37.8 02/27/2023   MCV 93.3 02/27/2023   PLT 488 (H) 02/27/2023      Chemistry      Component Value Date/Time   NA 134 (L) 02/10/2023 0458   NA 137 11/25/2022 0905   K 4.0 02/10/2023 0458   CL 100 02/10/2023 0458   CO2 25 02/10/2023 0458   BUN 8 02/10/2023 0458   BUN 15 11/25/2022 0905   CREATININE 0.61 02/10/2023 0458      Component Value Date/Time   CALCIUM 8.8 (L) 02/10/2023 0458   ALKPHOS 75 02/10/2023 0458   AST 37 02/10/2023 0458   ALT 47 (H) 02/10/2023 0458   BILITOT 0.7 02/10/2023 0458   BILITOT 0.4 11/25/2022 0905      VON WILLEBRAND FACTOR ASSESSMENT :  - The VWF:Ag is normal. The ZOX:WRUEAVWU is normal. The FVIII  is normal.   - These results are not consistent with a diagnosis of von  Willebrand Disease.    Latest Reference Range & Units 02/27/23 11:45  Coagulation Factor VIII 56 - 140 % 77  Von Willebrand Antigen, Plasma 50 - 200 % 60  Prothrombin Time 11.4 - 15.2 seconds 14.1  INR 0.8 - 1.2  1.1  APTT 24 - 36 seconds 32

## 2023-03-13 NOTE — Assessment & Plan Note (Signed)
 Patient has mildly elevated platelets likely reactive.  No other abnormal lab findings. -Continue iron supplements every other day -Maintain diet high in protein and greens.  If the platelets remain high, recommended the patient to reach out to [place] for follow-up.

## 2023-03-13 NOTE — Assessment & Plan Note (Signed)
 Patient has a concern of von Willebrand disease based on previous workup.  History of heavy menstrual bleeding in adolescence, now improved. Previous testing in 2014 [facility]) showed von Willebrand factor levels of 35. Currently, menstrual bleeding is manageable with occasional clotting. No other significant bleeding symptoms reported.  Labs as below- normal showing no concerns for von Willebrand disease.  Von Willebrand disease ruled out.  No hematology needs at this time.  Recommended patient to reach out to [place] with any questions or concerns in the future.

## 2023-03-23 ENCOUNTER — Telehealth (HOSPITAL_COMMUNITY): Payer: Self-pay

## 2023-03-23 NOTE — Telephone Encounter (Signed)
 03/27/23 appt confirmed by pt

## 2023-03-27 ENCOUNTER — Ambulatory Visit (HOSPITAL_COMMUNITY): Payer: Managed Care, Other (non HMO) | Admitting: Registered Nurse
